# Patient Record
Sex: Male | Born: 1970 | Hispanic: Yes | Marital: Single | Smoking: Never smoker
Health system: Southern US, Community
[De-identification: ages and names within clinical notes are randomized; demographics above are authoritative.]

## PROBLEM LIST (undated history)

## (undated) ENCOUNTER — Ambulatory Visit (HOSPITAL_COMMUNITY): Payer: Self-pay | Source: Home / Self Care

## (undated) DIAGNOSIS — E785 Hyperlipidemia, unspecified: Secondary | ICD-10-CM

## (undated) DIAGNOSIS — K219 Gastro-esophageal reflux disease without esophagitis: Secondary | ICD-10-CM

## (undated) HISTORY — DX: Gastro-esophageal reflux disease without esophagitis: K21.9

## (undated) HISTORY — PX: PTERYGIUM EXCISION: SHX2273

## (undated) HISTORY — PX: OTHER SURGICAL HISTORY: SHX169

---
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
## 2009-05-03 HISTORY — PX: CARPAL TUNNEL RELEASE: SHX101

## 2015-04-22 DIAGNOSIS — S6710XA Crushing injury of unspecified finger(s), initial encounter: Secondary | ICD-10-CM

## 2015-04-22 HISTORY — DX: Crushing injury of unspecified finger(s), initial encounter: S67.10XA

## 2015-08-27 DIAGNOSIS — Z01818 Encounter for other preprocedural examination: Secondary | ICD-10-CM | POA: Insufficient documentation

## 2015-09-02 DIAGNOSIS — L608 Other nail disorders: Secondary | ICD-10-CM | POA: Insufficient documentation

## 2017-05-16 DIAGNOSIS — E782 Mixed hyperlipidemia: Secondary | ICD-10-CM | POA: Diagnosis not present

## 2017-05-16 DIAGNOSIS — K219 Gastro-esophageal reflux disease without esophagitis: Secondary | ICD-10-CM | POA: Diagnosis not present

## 2017-09-19 DIAGNOSIS — K219 Gastro-esophageal reflux disease without esophagitis: Secondary | ICD-10-CM | POA: Diagnosis not present

## 2017-09-19 DIAGNOSIS — E782 Mixed hyperlipidemia: Secondary | ICD-10-CM | POA: Diagnosis not present

## 2017-09-19 DIAGNOSIS — J060 Acute laryngopharyngitis: Secondary | ICD-10-CM | POA: Diagnosis not present

## 2018-01-26 DIAGNOSIS — N5311 Retarded ejaculation: Secondary | ICD-10-CM | POA: Diagnosis not present

## 2018-01-26 DIAGNOSIS — L2089 Other atopic dermatitis: Secondary | ICD-10-CM | POA: Diagnosis not present

## 2018-03-02 DIAGNOSIS — K219 Gastro-esophageal reflux disease without esophagitis: Secondary | ICD-10-CM | POA: Diagnosis not present

## 2018-03-02 DIAGNOSIS — R072 Precordial pain: Secondary | ICD-10-CM | POA: Diagnosis not present

## 2018-03-02 DIAGNOSIS — E782 Mixed hyperlipidemia: Secondary | ICD-10-CM | POA: Diagnosis not present

## 2018-03-14 DIAGNOSIS — N50819 Testicular pain, unspecified: Secondary | ICD-10-CM | POA: Diagnosis not present

## 2018-03-14 DIAGNOSIS — R351 Nocturia: Secondary | ICD-10-CM | POA: Diagnosis not present

## 2018-04-24 DIAGNOSIS — L988 Other specified disorders of the skin and subcutaneous tissue: Secondary | ICD-10-CM | POA: Diagnosis not present

## 2019-08-29 ENCOUNTER — Ambulatory Visit: Payer: BLUE CROSS/BLUE SHIELD | Admitting: Physician Assistant

## 2019-08-31 ENCOUNTER — Ambulatory Visit: Payer: Self-pay | Admitting: Physician Assistant

## 2019-09-14 ENCOUNTER — Ambulatory Visit: Payer: Self-pay | Admitting: Physician Assistant

## 2019-09-20 ENCOUNTER — Ambulatory Visit: Payer: Self-pay | Admitting: Physician Assistant

## 2019-09-21 ENCOUNTER — Ambulatory Visit (INDEPENDENT_AMBULATORY_CARE_PROVIDER_SITE_OTHER): Payer: Self-pay | Admitting: Physician Assistant

## 2019-09-21 ENCOUNTER — Encounter: Payer: Self-pay | Admitting: Physician Assistant

## 2019-09-21 ENCOUNTER — Other Ambulatory Visit: Payer: Self-pay

## 2019-09-21 VITALS — BP 108/62 | HR 86 | Temp 97.9°F | Ht 64.0 in | Wt 165.0 lb

## 2019-09-21 DIAGNOSIS — E782 Mixed hyperlipidemia: Secondary | ICD-10-CM

## 2019-09-21 DIAGNOSIS — N489 Disorder of penis, unspecified: Secondary | ICD-10-CM

## 2019-09-21 NOTE — Assessment & Plan Note (Signed)
Will do HSV testing Recommend to use the lotrisone cream he has bid on area until results back
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
## 2019-09-21 NOTE — Assessment & Plan Note (Signed)
Well controlled.  ?No changes to medicines.  ?Continue to work on eating a healthy diet and exercise.  ?Labs drawn today.  ?

## 2019-09-22 LAB — CBC WITH DIFFERENTIAL/PLATELET
Basophils Absolute: 0.1 10*3/uL (ref 0.0–0.2)
Basos: 1 %
EOS (ABSOLUTE): 0.2 10*3/uL (ref 0.0–0.4)
Eos: 3 %
Hematocrit: 46.8 % (ref 37.5–51.0)
Hemoglobin: 16 g/dL (ref 13.0–17.7)
Immature Grans (Abs): 0 10*3/uL (ref 0.0–0.1)
Immature Granulocytes: 0 %
Lymphocytes Absolute: 2.4 10*3/uL (ref 0.7–3.1)
Lymphs: 35 %
MCH: 30.4 pg (ref 26.6–33.0)
MCHC: 34.2 g/dL (ref 31.5–35.7)
MCV: 89 fL (ref 79–97)
Monocytes Absolute: 0.5 10*3/uL (ref 0.1–0.9)
Monocytes: 8 %
Neutrophils Absolute: 3.6 10*3/uL (ref 1.4–7.0)
Neutrophils: 53 %
Platelets: 307 10*3/uL (ref 150–450)
RBC: 5.26 x10E6/uL (ref 4.14–5.80)
RDW: 13.2 % (ref 11.6–15.4)
WBC: 6.8 10*3/uL (ref 3.4–10.8)

## 2019-09-22 LAB — COMPREHENSIVE METABOLIC PANEL
ALT: 37 IU/L (ref 0–44)
AST: 28 IU/L (ref 0–40)
Albumin/Globulin Ratio: 1.4 (ref 1.2–2.2)
Albumin: 4.7 g/dL (ref 4.0–5.0)
Alkaline Phosphatase: 81 IU/L (ref 48–121)
BUN/Creatinine Ratio: 15 (ref 9–20)
BUN: 15 mg/dL (ref 6–24)
Bilirubin Total: 0.4 mg/dL (ref 0.0–1.2)
CO2: 23 mmol/L (ref 20–29)
Calcium: 9.9 mg/dL (ref 8.7–10.2)
Chloride: 102 mmol/L (ref 96–106)
Creatinine, Ser: 1.03 mg/dL (ref 0.76–1.27)
GFR calc Af Amer: 97 mL/min/{1.73_m2} (ref >59)
GFR calc non Af Amer: 84 mL/min/{1.73_m2} (ref >59)
Globulin, Total: 3.4 g/dL (ref 1.5–4.5)
Glucose: 110 mg/dL — ABNORMAL HIGH (ref 65–99)
Potassium: 4.6 mmol/L (ref 3.5–5.2)
Sodium: 137 mmol/L (ref 134–144)
Total Protein: 8.1 g/dL (ref 6.0–8.5)

## 2019-09-22 LAB — HSV(HERPES SIMPLEX VRS) I + II AB-IGG
HSV 1 Glycoprotein G Ab, IgG: 42.9 index — ABNORMAL HIGH (ref 0.00–0.90)
HSV 2 IgG, Type Spec: 5.07 index — ABNORMAL HIGH (ref 0.00–0.90)

## 2019-09-24 ENCOUNTER — Other Ambulatory Visit: Payer: Self-pay | Admitting: Physician Assistant

## 2019-09-24 DIAGNOSIS — E782 Mixed hyperlipidemia: Secondary | ICD-10-CM

## 2019-09-24 MED ORDER — ROSUVASTATIN CALCIUM 20 MG PO TABS: 20.0000 mg | ORAL_TABLET | Freq: Every day | ORAL | 5 refills | Status: DC

## 2019-09-24 MED ORDER — VALACYCLOVIR HCL 1 G PO TABS
500.0000 mg | ORAL_TABLET | Freq: Two times a day (BID) | ORAL | 3 refills | Status: DC
Start: 2019-09-24 — End: 2020-07-22

## 2019-09-25 LAB — LIPID PANEL W/O CHOL/HDL RATIO
Cholesterol, Total: 259 mg/dL — ABNORMAL HIGH (ref 100–199)
HDL: 35 mg/dL — ABNORMAL LOW (ref >39)
LDL Chol Calc (NIH): 112 mg/dL — ABNORMAL HIGH (ref 0–99)
Triglycerides: 634 mg/dL (ref 0–149)
VLDL Cholesterol Cal: 112 mg/dL — ABNORMAL HIGH (ref 5–40)

## 2019-09-25 LAB — CARDIOVASCULAR RISK ASSESSMENT

## 2019-09-25 LAB — SPECIMEN STATUS REPORT

## 2019-09-26 ENCOUNTER — Other Ambulatory Visit: Payer: Self-pay | Admitting: Physician Assistant

## 2019-09-26 MED ORDER — ROSUVASTATIN CALCIUM 40 MG PO TABS: 40.0000 mg | ORAL_TABLET | Freq: Every day | ORAL | 1 refills | Status: DC

## 2020-03-21 ENCOUNTER — Ambulatory Visit (INDEPENDENT_AMBULATORY_CARE_PROVIDER_SITE_OTHER): Payer: Self-pay | Admitting: Legal Medicine

## 2020-03-21 ENCOUNTER — Ambulatory Visit: Payer: Self-pay | Admitting: Physician Assistant

## 2020-03-21 ENCOUNTER — Other Ambulatory Visit: Payer: Self-pay

## 2020-03-21 ENCOUNTER — Encounter: Payer: Self-pay | Admitting: Legal Medicine

## 2020-03-21 VITALS — BP 110/60 | HR 78 | Temp 97.9°F | Resp 16 | Ht 64.0 in | Wt 155.0 lb

## 2020-03-21 DIAGNOSIS — K2100 Gastro-esophageal reflux disease with esophagitis, without bleeding: Secondary | ICD-10-CM

## 2020-03-21 DIAGNOSIS — K219 Gastro-esophageal reflux disease without esophagitis: Secondary | ICD-10-CM | POA: Insufficient documentation

## 2020-03-21 DIAGNOSIS — E782 Mixed hyperlipidemia: Secondary | ICD-10-CM

## 2020-03-21 MED ORDER — OMEPRAZOLE 40 MG PO CPDR: 40.0000 mg | DELAYED_RELEASE_CAPSULE | Freq: Every day | ORAL | 2 refills | Status: DC

## 2020-03-21 MED ORDER — ROSUVASTATIN CALCIUM 40 MG PO TABS: 40.0000 mg | ORAL_TABLET | Freq: Every day | ORAL | 2 refills | Status: DC

## 2020-03-21 NOTE — Patient Instructions (Signed)
Cuidados preventivos en los hombres de 40 a 64 aos de edad Preventive Care 40-50 Years Old, Male Los cuidados preventivos hacen referencia a las opciones en cuanto al estilo de vida y a las visitas al mdico, las cuales pueden promover la salud y el bienestar. Esto puede comprender lo siguiente:  Un examen fsico anual. Esto tambin se conoce como control de bienestar anual.  Exmenes dentales y oculares de manera regular.  Vacunas.  Estudios para detectar ciertas enfermedades.  Opciones saludables de estilo de vida, como seguir una dieta saludable, hacer ejercicio regularmente, no usar drogas ni productos que contengan nicotina y tabaco, y limitar el consumo de alcohol. Qu puedo esperar para mi visita de cuidado preventivo? Examen fsico El mdico controlar lo siguiente:  Estatura y peso. Estos datos se pueden usar para calcular el ndice de masa corporal (IMC), una medicin que indica si usted tiene un peso saludable.  Frecuencia cardaca y presin arterial.  Piel para detectar manchas anormales. Asesoramiento El mdico puede hacerle preguntas sobre lo siguiente:  Consumo de tabaco, alcohol y drogas.  Bienestar emocional.  Bienestar en el hogar y sus relaciones personales.  Actividad sexual.  Hbitos de alimentacin.  Trabajo y ambiente laboral. Qu vacunas necesito?  Vacuna antigripal  Se recomienda aplicarse esta vacuna todos los aos. Vacuna contra el ttanos, la difteria y la tos ferina (Tdap)  Es posible que tenga que aplicarse un refuerzo contra el ttanos y la difteria (DT) cada 10aos. Vacuna contra la varicela  Es posible que tenga que aplicrsela si an no la recibi. Vacuna contra el herpes zster (culebrilla)  Es posible que la necesite despus de los 60 aos de edad. Vacuna contra el sarampin, la rubola y las paperas (SRP)  Es posible que necesite aplicarse al menos una dosis de la vacuna SRP si naci despus de 1957. Tambin es posible que  necesite una segunda dosis. Vacuna antineumoccica conjugada (PCV13)  Puede necesitar esta vacuna si tiene determinadas enfermedades y no se vacun anteriormente. Vacuna antineumoccica de polisacridos (PPSV23)  Quizs tenga que aplicarse una o dos dosis si fuma o si tiene determinadas afecciones. Vacuna antimeningoccica conjugada (MenACWY)  Puede necesitar esta vacuna si tiene determinadas afecciones. Vacuna contra la hepatitis A  Es posible que necesite esta vacuna si tiene ciertas afecciones o si viaja o trabaja en lugares en los que podra estar expuesto a la hepatitis A. Vacuna contra la hepatitis B  Es posible que necesite esta vacuna si tiene ciertas afecciones o si viaja o trabaja en lugares en los que podra estar expuesto a la hepatitis B. Vacuna antihaemophilus influenzae tipo B (Hib)  Es posible que necesite esta vacuna si tiene algunos factores de riesgo. Vacuna contra el virus del papiloma humano (VPH)  Si el mdico se lo recomienda, puede necesitar tres dosis a lo largo de 6 meses. Puede recibir las vacunas en forma de dosis individuales o en forma de dos o ms vacunas juntas en la misma inyeccin (vacunas combinadas). Hable con su mdico sobre los riesgos y beneficios de las vacunas combinadas. Qu pruebas necesito? Anlisis de sangre  Niveles de lpidos y colesterol. Estos se pueden verificar cada 5 aos o, con ms frecuencia, si usted tiene ms de 50 aos de edad.  Anlisis de hepatitisC.  Anlisis de hepatitisB. Pruebas de deteccin  Pruebas de deteccin de cncer de pulmn. Es posible que se le realice esta prueba de deteccin a partir de los 55 aos de edad, si ha fumado durante 30 aos un   paquete diario y sigue fumando o dej el hbito en algn momento en los ltimos 15 aos.  Examen de deteccin del cncer de prstata. Las recomendaciones variarn segn sus antecedentes familiares y otros riesgos.  Pruebas de deteccin de cncer colorrectal. Todos los  adultos a partir de los 50 aos de edad y hasta los 75 aos de edad deben hacerse esta prueba de deteccin. El mdico puede recomendarle las pruebas de deteccin a partir de los 45 aos de edad si corre un mayor riesgo. Le realizarn pruebas cada 1 a 10 aos, segn los resultados y el tipo de prueba de deteccin.  Pruebas de deteccin de la diabetes. Esto se realiza mediante un control del azcar en la sangre (glucosa) despus de no haber comido durante un periodo de tiempo (ayuno). Es posible que se le realice esta prueba cada 1 a 3 aos.  Anlisis de enfermedades de transmisin sexual (ETS). Siga estas instrucciones en su casa: Comida y bebida  Siga una dieta que incluya frutas y verduras frescas, cereales integrales, protenas magras y productos lcteos descremados.  Tome los suplementos vitamnicos y minerales como se lo haya indicado el mdico.  No beba alcohol si el mdico se lo prohbe.  Si bebe alcohol: ? Limite la cantidad que consume de 0 a 2 medidas por da. ? Est atento a la cantidad de alcohol que hay en las bebidas que toma. En [place], una medida equivale a una botella de cerveza de 12oz (355ml), un vaso de vino de 5oz (148ml) o un vaso de una bebida alcohlica de alta graduacin de 1oz (44ml). Estilo de vida  Cudese los dientes y las encas a diario.  Mantngase activo. Haga al menos 30minutos de ejercicio 5o ms das de la semana.  No consuma ningn producto que contenga nicotina o tabaco, como cigarrillos, cigarrillos electrnicos y tabaco de mascar. Si necesita ayuda para dejar de fumar, consulte al mdico.  Si es sexualmente activo, practique sexo seguro. Use un condn u otra forma de proteccin para prevenir las ITS (infecciones de transmisin sexual).  Hable con el mdico acerca de tomar una dosis baja de aspirina o estatina todos los das a partir de los 50 aos. Cundo volver?  Acuda al mdico una vez al ao para una visita de  control.  Pregntele al mdico con qu frecuencia debe realizarse un control de la vista y los dientes.  Mantenga su esquema de vacunacin al da. Esta informacin no tiene como fin reemplazar el consejo del mdico. Asegrese de hacerle al mdico cualquier pregunta que tenga. Document Revised: 05/12/2018 Document Reviewed: 05/12/2018 Elsevier Patient Education  2020 Elsevier Inc.  
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
## 2020-03-22 LAB — COMPREHENSIVE METABOLIC PANEL
ALT: 28 IU/L (ref 0–44)
AST: 22 IU/L (ref 0–40)
Albumin/Globulin Ratio: 1.7 (ref 1.2–2.2)
Albumin: 5.1 g/dL — ABNORMAL HIGH (ref 4.0–5.0)
Alkaline Phosphatase: 81 IU/L (ref 44–121)
BUN/Creatinine Ratio: 15 (ref 9–20)
BUN: 16 mg/dL (ref 6–24)
Bilirubin Total: 0.5 mg/dL (ref 0.0–1.2)
CO2: 21 mmol/L (ref 20–29)
Calcium: 10 mg/dL (ref 8.7–10.2)
Chloride: 102 mmol/L (ref 96–106)
Creatinine, Ser: 1.04 mg/dL (ref 0.76–1.27)
GFR calc Af Amer: 96 mL/min/{1.73_m2} (ref >59)
GFR calc non Af Amer: 83 mL/min/{1.73_m2} (ref >59)
Globulin, Total: 3 g/dL (ref 1.5–4.5)
Glucose: 109 mg/dL — ABNORMAL HIGH (ref 65–99)
Potassium: 4.8 mmol/L (ref 3.5–5.2)
Sodium: 140 mmol/L (ref 134–144)
Total Protein: 8.1 g/dL (ref 6.0–8.5)

## 2020-03-22 LAB — CBC WITH DIFFERENTIAL/PLATELET
Basophils Absolute: 0.1 10*3/uL (ref 0.0–0.2)
Basos: 1 %
EOS (ABSOLUTE): 0.2 10*3/uL (ref 0.0–0.4)
Eos: 4 %
Hematocrit: 46.3 % (ref 37.5–51.0)
Hemoglobin: 15.8 g/dL (ref 13.0–17.7)
Immature Grans (Abs): 0 10*3/uL (ref 0.0–0.1)
Immature Granulocytes: 0 %
Lymphocytes Absolute: 2.6 10*3/uL (ref 0.7–3.1)
Lymphs: 43 %
MCH: 29.5 pg (ref 26.6–33.0)
MCHC: 34.1 g/dL (ref 31.5–35.7)
MCV: 87 fL (ref 79–97)
Monocytes Absolute: 0.5 10*3/uL (ref 0.1–0.9)
Monocytes: 9 %
Neutrophils Absolute: 2.6 10*3/uL (ref 1.4–7.0)
Neutrophils: 43 %
Platelets: 332 10*3/uL (ref 150–450)
RBC: 5.35 x10E6/uL (ref 4.14–5.80)
RDW: 12.8 % (ref 11.6–15.4)
WBC: 6 10*3/uL (ref 3.4–10.8)

## 2020-03-22 LAB — LIPID PANEL
Chol/HDL Ratio: 7 ratio — ABNORMAL HIGH (ref 0.0–5.0)
Cholesterol, Total: 281 mg/dL — ABNORMAL HIGH (ref 100–199)
HDL: 40 mg/dL (ref >39)
LDL Chol Calc (NIH): 166 mg/dL — ABNORMAL HIGH (ref 0–99)
Triglycerides: 386 mg/dL — ABNORMAL HIGH (ref 0–149)
VLDL Cholesterol Cal: 75 mg/dL — ABNORMAL HIGH (ref 5–40)

## 2020-03-22 LAB — CARDIOVASCULAR RISK ASSESSMENT

## 2020-03-23 NOTE — Progress Notes (Signed)
CBC negative, glucose 109, kidney and liver tests normal, triglycerides very high and LDL cholesterol very high.  If her taking rosuvastatin regularly? May need lipid clinic, he is at high risk to cv disease and stroke early. lp

## 2020-07-21 ENCOUNTER — Ambulatory Visit: Payer: Self-pay | Admitting: Legal Medicine

## 2020-07-22 ENCOUNTER — Ambulatory Visit (INDEPENDENT_AMBULATORY_CARE_PROVIDER_SITE_OTHER): Payer: 59 | Admitting: Legal Medicine

## 2020-07-22 ENCOUNTER — Encounter: Payer: Self-pay | Admitting: Legal Medicine

## 2020-07-22 ENCOUNTER — Other Ambulatory Visit: Payer: Self-pay

## 2020-07-22 DIAGNOSIS — R1084 Generalized abdominal pain: Secondary | ICD-10-CM

## 2020-07-22 DIAGNOSIS — L720 Epidermal cyst: Secondary | ICD-10-CM

## 2020-07-22 DIAGNOSIS — R109 Unspecified abdominal pain: Secondary | ICD-10-CM | POA: Insufficient documentation
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
## 2020-07-22 NOTE — Patient Instructions (Signed)
Cuidado de las heridas, en adultos Wound Care, Adult El cuidado correcto de las heridas puede ayudar a Psychologist, sport and exercise y a Radio producer las infecciones y formacin de cicatrices. Adems, puede ayudar a que la cicatrizacin sea ms rpida. Siga las instrucciones del mdico acerca del cuidado de la herida. Materiales necesarios:  [place] y [place].  Limpiador de heridas.  Gasa.  Si es necesario, una venda limpia (vendaje) u otro tipo de material de vendaje para cubrir o Pharmacist, hospital herida. Siga las instrucciones del mdico acerca de qu materiales de vendaje usar.  Crema o pomada para aplicar en la herida, si se lo indic el mdico. Cmo cuidar la herida Limpieza de la herida Pregntele al mdico cmo limpiar la herida. Esto puede incluir:  Usar agua y [place] o un limpiador de heridas.  Usar una gasa limpia para secar la herida dando palmaditas despus de limpiarla. No se frote ni restriegue la herida. Cuidado del vendaje  Lvese las manos con agua y [place] durante al menos 20segundos antes y despus de cambiar el vendaje. Use desinfectante para manos si no dispone de [place] y [place].  Cambie el vendaje como se lo haya indicado el mdico. Esto puede incluir: ? Limpieza o enjuague (irrigacin) de la herida. ? Colocar un vendaje sobre la herida o dentro de la herida (taponamiento). ? Cubrir la herida con un vendaje externo.  No retire los puntos (suturas), la goma para cerrar la piel o las tiras [place]. Es posible que estos cierres cutneos deban quedar puestos en la piel durante 2semanas o ms tiempo. Si los bordes de las tiras [address] empiezan a despegarse y Scientific laboratory technician, puede recortar los que estn sueltos. No retire las tiras [employer] por completo a menos que el mdico se lo indique.  Pregntele al mdico cundo puede dejar la herida al descubierto. Deteccin de Building control surveyor la zona de la herida [employer] para detectar signos de infeccin. Est atento a los siguientes  signos:  Aumento del enrojecimiento, la hinchazn o Chief Technology Officer.  Lquido o sangre.  Calor.  Pus o mal olor.   Sigue estas instrucciones en tu casa Medicamentos  Si le recetaron un medicamento, una crema o un ungento con antibitico, tmelo o aplqueselo como se lo haya indicado el mdico. No deje de usar el antibitico aunque la afeccin mejore.  Si le recetaron Research scientist (physical sciences), tmelo antes de Sales executive cuidado de la herida, [place] se lo haya indicado el mdico.  [employer] medicamentos de venta libre y los recetados solamente como se lo haya indicado el mdico. Comida y bebida  Siga una dieta que incluya protenas, vitaminas A y C y otros alimentos ricos en nutrientes que ayudarn a que la herida cicatrice. ? Los alimentos ricos en protena incluyen carne, pescado, huevos, productos lcteos, frijoles y frutos secos. ? Los alimentos ricos en vitaminaA incluyen zanahorias y verduras de hoja verde oscuro. ? Los alimentos ricos en vitaminaC incluyen ctricos, tomates, brcoli y pimientos.  Beba suficiente lquido como para Pharmacologist la orina de color amarillo plido. Instrucciones generales  No tome baos de inmersin, nade, use el jacuzzi ni haga ninguna actividad en la que sumerja la herida en agua hasta que el mdico lo autorice. Pregntele al mdico si puede ducharse. Delle Reining solo le permitan darse baos de [place].  No se rasque ni se toque la herida. Mantngala cubierta tal como se lo haya indicado el mdico.  Retome sus actividades normales segn lo indicado por el mdico. Pregntele  al mdico qu actividades son seguras para usted.  Proteja la herida del sol durante los primeros o durante el tiempo que le haya indicado el mdico. Cubra la zona de la cicatriz o aplquele pantalla solar con un factor de proteccin solar (FPS) de al menos 30.  No consuma ningn producto que contenga nicotina o tabaco, como cigarrillos, cigarrillos electrnicos y tabaco de Theatre manager.  El consumo de esos productos puede demorar la cicatrizacin de la herida. Si necesita ayuda para dejar de fumar, consulte al mdico.  Concurra a todas las visitas de seguimiento como se lo haya indicado el mdico. Esto es importante. Comunquese con un mdico si:  Le aplicaron la antitetnica y tiene hinchazn, dolor intenso, enrojecimiento o hemorragia en el sitio de la inyeccin.  El dolor no se alivia con los [employer].  Tiene cualquiera de estos signos de infeccin: ? Ms enrojecimiento, hinchazn o dolor alrededor de la herida. ? Lquido o [employer] de la herida. ? Calor que proviene de la herida. ? Advierte pus o mal olor que provienen de la herida. ? Grant Ruts o escalofros.  Siente nuseas o vomita.  Tiene mareos. Solicite ayuda de inmediato si:  Tiene una lnea roja en la piel cerca de la zona que rodea la herida.  La herida fue cerrada con grapas, suturas, goma para cerrar la piel o tiras [place] y estas comienzan a abrirse y a separarse.  La herida sangra y el sangrado no se detiene con una presin [place].  Tiene una erupcin cutnea.  Se desmaya.  Tiene dificultad para respirar. Estos sntomas pueden representar un problema grave que constituye Radio broadcast assistant. No espere a ver si los sntomas desaparecen. Solicite atencin mdica de inmediato. Comunquese con el servicio de emergencias de su localidad (911 en los [place]). No conduzca por sus propios medios [employer]. Resumen  Siempre lvese las manos con agua y jabn durante al menos 20segundos antes y despus de cambiar el vendaje.  Cambie el vendaje como se lo haya indicado el mdico.  Para ayudar con la cicatrizacin, coma alimentos ricos en protenas, vitaminas A y C y dems nutrientes.  Controle la herida [employer] para detectar signos de infeccin. Comunquese con el mdico si sospecha que la herida est infectada. Esta informacin no tiene Theme park manager el consejo del  mdico. Asegrese de hacerle al mdico cualquier pregunta que tenga. Document Revised: 05/01/2019 Document Reviewed: 05/01/2019 Elsevier Patient Education  2021 [employer].

## 2020-07-27 LAB — CDIFF NAA+O+P+STOOL CULTURE
E coli, Shiga toxin Assay: NEGATIVE
Toxigenic C. Difficile by PCR: NEGATIVE

## 2020-07-28 ENCOUNTER — Other Ambulatory Visit: Payer: Self-pay

## 2020-07-28 ENCOUNTER — Ambulatory Visit (INDEPENDENT_AMBULATORY_CARE_PROVIDER_SITE_OTHER): Payer: 59 | Admitting: Legal Medicine

## 2020-07-28 ENCOUNTER — Encounter: Payer: Self-pay | Admitting: Legal Medicine

## 2020-07-28 VITALS — BP 124/80 | HR 102 | Temp 98.1°F | Resp 16 | Ht 64.0 in | Wt 160.0 lb

## 2020-07-28 DIAGNOSIS — L720 Epidermal cyst: Secondary | ICD-10-CM | POA: Diagnosis not present

## 2020-07-28 DIAGNOSIS — R1084 Generalized abdominal pain: Secondary | ICD-10-CM

## 2020-07-28 DIAGNOSIS — R1011 Right upper quadrant pain: Secondary | ICD-10-CM | POA: Diagnosis not present
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
## 2020-07-28 NOTE — Progress Notes (Signed)
Stool cultures normal lp

## 2020-07-29 ENCOUNTER — Other Ambulatory Visit: Payer: Self-pay | Admitting: Legal Medicine

## 2020-07-29 ENCOUNTER — Other Ambulatory Visit: Payer: Self-pay

## 2020-07-29 ENCOUNTER — Telehealth: Payer: Self-pay

## 2020-07-29 LAB — ANAEROBIC AND AEROBIC CULTURE

## 2020-07-29 LAB — HEPATIC FUNCTION PANEL
ALT: 36 IU/L (ref 0–44)
AST: 28 IU/L (ref 0–40)
Albumin: 4.8 g/dL (ref 4.0–5.0)
Alkaline Phosphatase: 76 IU/L (ref 44–121)
Bilirubin Total: 0.3 mg/dL (ref 0.0–1.2)
Bilirubin, Direct: <0.1 mg/dL (ref 0.00–0.40)
Total Protein: 7.7 g/dL (ref 6.0–8.5)

## 2020-07-29 MED ORDER — CIPROFLOXACIN HCL 500 MG PO TABS: 500.0000 mg | ORAL_TABLET | Freq: Two times a day (BID) | ORAL | 0 refills | Status: DC

## 2020-07-29 NOTE — Telephone Encounter (Signed)
Patient is allergic to sulfas. Do you want me to send another antibiotic?

## 2020-07-29 NOTE — Telephone Encounter (Signed)
Patient was informed.

## 2020-07-29 NOTE — Telephone Encounter (Signed)
I called in cipro lp

## 2020-07-29 NOTE — Progress Notes (Signed)
Liver tests all normal,  lp

## 2020-07-29 NOTE — Progress Notes (Signed)
Culture grew klebsiella sensitive to bactrim, finish medicine lp

## 2020-08-05 ENCOUNTER — Ambulatory Visit: Payer: 59 | Admitting: Legal Medicine

## 2020-08-13 ENCOUNTER — Ambulatory Visit: Payer: 59 | Admitting: Legal Medicine

## 2020-08-19 ENCOUNTER — Encounter: Payer: Self-pay | Admitting: Legal Medicine

## 2020-08-19 ENCOUNTER — Other Ambulatory Visit: Payer: Self-pay

## 2020-08-19 ENCOUNTER — Ambulatory Visit (INDEPENDENT_AMBULATORY_CARE_PROVIDER_SITE_OTHER): Payer: 59 | Admitting: Legal Medicine

## 2020-08-19 VITALS — BP 118/60 | HR 96 | Temp 98.3°F | Resp 16 | Ht 64.0 in | Wt 156.0 lb

## 2020-08-19 DIAGNOSIS — F5102 Adjustment insomnia: Secondary | ICD-10-CM

## 2020-08-19 DIAGNOSIS — G4700 Insomnia, unspecified: Secondary | ICD-10-CM | POA: Insufficient documentation

## 2020-08-19 DIAGNOSIS — R1084 Generalized abdominal pain: Secondary | ICD-10-CM

## 2020-08-19 MED ORDER — TRAZODONE HCL 50 MG PO TABS
50.0000 mg | ORAL_TABLET | Freq: Every day | ORAL | 3 refills | Status: DC
Start: 2020-08-19 — End: 2021-06-11
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
## 2020-09-19 ENCOUNTER — Ambulatory Visit: Payer: Self-pay | Admitting: Legal Medicine

## 2020-09-19 ENCOUNTER — Ambulatory Visit: Payer: Self-pay | Admitting: Physician Assistant

## 2020-09-26 ENCOUNTER — Encounter: Payer: Self-pay | Admitting: Physician Assistant

## 2020-09-26 ENCOUNTER — Ambulatory Visit (INDEPENDENT_AMBULATORY_CARE_PROVIDER_SITE_OTHER): Payer: 59 | Admitting: Physician Assistant

## 2020-09-26 ENCOUNTER — Other Ambulatory Visit: Payer: Self-pay

## 2020-09-26 VITALS — BP 108/68 | HR 74 | Temp 97.5°F | Ht 64.0 in | Wt 152.8 lb

## 2020-09-26 DIAGNOSIS — Z1159 Encounter for screening for other viral diseases: Secondary | ICD-10-CM | POA: Diagnosis not present

## 2020-09-26 DIAGNOSIS — Z114 Encounter for screening for human immunodeficiency virus [HIV]: Secondary | ICD-10-CM | POA: Insufficient documentation

## 2020-09-26 DIAGNOSIS — E782 Mixed hyperlipidemia: Secondary | ICD-10-CM

## 2020-09-26 MED ORDER — ROSUVASTATIN CALCIUM 40 MG PO TABS: 40.0000 mg | ORAL_TABLET | Freq: Every day | ORAL | 5 refills | Status: DC
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
## 2020-09-27 LAB — CBC WITH DIFFERENTIAL/PLATELET
Basophils Absolute: 0.1 10*3/uL (ref 0.0–0.2)
Basos: 1 %
EOS (ABSOLUTE): 0.1 10*3/uL (ref 0.0–0.4)
Eos: 1 %
Hematocrit: 42.4 % (ref 37.5–51.0)
Hemoglobin: 14.3 g/dL (ref 13.0–17.7)
Immature Grans (Abs): 0 10*3/uL (ref 0.0–0.1)
Immature Granulocytes: 0 %
Lymphocytes Absolute: 1.9 10*3/uL (ref 0.7–3.1)
Lymphs: 31 %
MCH: 29.5 pg (ref 26.6–33.0)
MCHC: 33.7 g/dL (ref 31.5–35.7)
MCV: 88 fL (ref 79–97)
Monocytes Absolute: 0.4 10*3/uL (ref 0.1–0.9)
Monocytes: 7 %
Neutrophils Absolute: 3.7 10*3/uL (ref 1.4–7.0)
Neutrophils: 60 %
Platelets: 322 10*3/uL (ref 150–450)
RBC: 4.84 x10E6/uL (ref 4.14–5.80)
RDW: 13.1 % (ref 11.6–15.4)
WBC: 6.2 10*3/uL (ref 3.4–10.8)

## 2020-09-27 LAB — COMPREHENSIVE METABOLIC PANEL
ALT: 21 IU/L (ref 0–44)
AST: 23 IU/L (ref 0–40)
Albumin/Globulin Ratio: 1.8 (ref 1.2–2.2)
Albumin: 4.7 g/dL (ref 3.8–4.9)
Alkaline Phosphatase: 77 IU/L (ref 44–121)
BUN/Creatinine Ratio: 16 (ref 9–20)
BUN: 14 mg/dL (ref 6–24)
Bilirubin Total: 0.4 mg/dL (ref 0.0–1.2)
CO2: 23 mmol/L (ref 20–29)
Calcium: 9.4 mg/dL (ref 8.7–10.2)
Chloride: 104 mmol/L (ref 96–106)
Creatinine, Ser: 0.9 mg/dL (ref 0.76–1.27)
Globulin, Total: 2.6 g/dL (ref 1.5–4.5)
Glucose: 109 mg/dL — ABNORMAL HIGH (ref 65–99)
Potassium: 4.3 mmol/L (ref 3.5–5.2)
Sodium: 140 mmol/L (ref 134–144)
Total Protein: 7.3 g/dL (ref 6.0–8.5)
eGFR: 103 mL/min/{1.73_m2} (ref >59)

## 2020-09-27 LAB — LIPID PANEL
Chol/HDL Ratio: 6 ratio — ABNORMAL HIGH (ref 0.0–5.0)
Cholesterol, Total: 228 mg/dL — ABNORMAL HIGH (ref 100–199)
HDL: 38 mg/dL — ABNORMAL LOW (ref >39)
LDL Chol Calc (NIH): 147 mg/dL — ABNORMAL HIGH (ref 0–99)
Triglycerides: 233 mg/dL — ABNORMAL HIGH (ref 0–149)
VLDL Cholesterol Cal: 43 mg/dL — ABNORMAL HIGH (ref 5–40)

## 2020-09-27 LAB — HEPATITIS C ANTIBODY: Hep C Virus Ab: <0.1 s/co ratio (ref 0.0–0.9)

## 2020-09-27 LAB — CARDIOVASCULAR RISK ASSESSMENT

## 2020-09-27 LAB — HIV ANTIBODY (ROUTINE TESTING W REFLEX): HIV Screen 4th Generation wRfx: NONREACTIVE

## 2020-12-31 ENCOUNTER — Ambulatory Visit (INDEPENDENT_AMBULATORY_CARE_PROVIDER_SITE_OTHER): Payer: 59 | Admitting: Physician Assistant

## 2020-12-31 ENCOUNTER — Encounter: Payer: Self-pay | Admitting: Physician Assistant

## 2020-12-31 VITALS — BP 122/70 | HR 81 | Temp 97.4°F | Ht 64.0 in | Wt 153.6 lb

## 2020-12-31 DIAGNOSIS — J020 Streptococcal pharyngitis: Secondary | ICD-10-CM | POA: Diagnosis not present

## 2020-12-31 LAB — POCT RAPID STREP A (OFFICE): Rapid Strep A Screen: POSITIVE — AB

## 2020-12-31 MED ORDER — AZITHROMYCIN 250 MG PO TABS: ORAL_TABLET | ORAL | 0 refills | Status: AC
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
## 2021-01-30 ENCOUNTER — Ambulatory Visit (INDEPENDENT_AMBULATORY_CARE_PROVIDER_SITE_OTHER): Payer: Self-pay | Admitting: Physician Assistant

## 2021-01-30 ENCOUNTER — Encounter: Payer: Self-pay | Admitting: Physician Assistant

## 2021-01-30 ENCOUNTER — Other Ambulatory Visit: Payer: Self-pay

## 2021-01-30 VITALS — BP 102/64 | HR 66 | Temp 97.3°F | Resp 18 | Ht 64.0 in | Wt 148.6 lb

## 2021-01-30 DIAGNOSIS — R634 Abnormal weight loss: Secondary | ICD-10-CM

## 2021-01-30 DIAGNOSIS — R632 Polyphagia: Secondary | ICD-10-CM

## 2021-01-30 DIAGNOSIS — E782 Mixed hyperlipidemia: Secondary | ICD-10-CM

## 2021-01-30 LAB — POCT URINALYSIS DIP (CLINITEK)
Bilirubin, UA: NEGATIVE
Blood, UA: NEGATIVE
Glucose, UA: NEGATIVE mg/dL
Ketones, POC UA: NEGATIVE mg/dL
Leukocytes, UA: NEGATIVE
Nitrite, UA: NEGATIVE
Spec Grav, UA: 1.025 (ref 1.010–1.025)
Urobilinogen, UA: 0.2 E.U./dL
pH, UA: 6 (ref 5.0–8.0)
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
## 2021-01-31 LAB — CARDIOVASCULAR RISK ASSESSMENT

## 2021-01-31 LAB — COMPREHENSIVE METABOLIC PANEL
ALT: 26 IU/L (ref 0–44)
AST: 32 IU/L (ref 0–40)
Albumin/Globulin Ratio: 1.4 (ref 1.2–2.2)
Albumin: 4.6 g/dL (ref 3.8–4.9)
Alkaline Phosphatase: 94 IU/L (ref 44–121)
BUN/Creatinine Ratio: 19 (ref 9–20)
BUN: 20 mg/dL (ref 6–24)
Bilirubin Total: 0.3 mg/dL (ref 0.0–1.2)
CO2: 20 mmol/L (ref 20–29)
Calcium: 9.5 mg/dL (ref 8.7–10.2)
Chloride: 100 mmol/L (ref 96–106)
Creatinine, Ser: 1.08 mg/dL (ref 0.76–1.27)
Globulin, Total: 3.2 g/dL (ref 1.5–4.5)
Glucose: 106 mg/dL — ABNORMAL HIGH (ref 70–99)
Potassium: 4.3 mmol/L (ref 3.5–5.2)
Sodium: 135 mmol/L (ref 134–144)
Total Protein: 7.8 g/dL (ref 6.0–8.5)
eGFR: 83 mL/min/{1.73_m2} (ref >59)

## 2021-01-31 LAB — LIPID PANEL
Chol/HDL Ratio: 5.6 ratio — ABNORMAL HIGH (ref 0.0–5.0)
Cholesterol, Total: 320 mg/dL — ABNORMAL HIGH (ref 100–199)
HDL: 57 mg/dL (ref >39)
LDL Chol Calc (NIH): 248 mg/dL — ABNORMAL HIGH (ref 0–99)
Triglycerides: 92 mg/dL (ref 0–149)
VLDL Cholesterol Cal: 15 mg/dL (ref 5–40)

## 2021-01-31 LAB — CBC WITH DIFFERENTIAL/PLATELET
Basophils Absolute: 0.1 10*3/uL (ref 0.0–0.2)
Basos: 2 %
EOS (ABSOLUTE): 0.3 10*3/uL (ref 0.0–0.4)
Eos: 5 %
Hematocrit: 43.5 % (ref 37.5–51.0)
Hemoglobin: 14.3 g/dL (ref 13.0–17.7)
Immature Grans (Abs): 0 10*3/uL (ref 0.0–0.1)
Immature Granulocytes: 0 %
Lymphocytes Absolute: 2.6 10*3/uL (ref 0.7–3.1)
Lymphs: 46 %
MCH: 28 pg (ref 26.6–33.0)
MCHC: 32.9 g/dL (ref 31.5–35.7)
MCV: 85 fL (ref 79–97)
Monocytes Absolute: 0.3 10*3/uL (ref 0.1–0.9)
Monocytes: 6 %
Neutrophils Absolute: 2.2 10*3/uL (ref 1.4–7.0)
Neutrophils: 41 %
Platelets: 441 10*3/uL (ref 150–450)
RBC: 5.1 x10E6/uL (ref 4.14–5.80)
RDW: 12.1 % (ref 11.6–15.4)
WBC: 5.4 10*3/uL (ref 3.4–10.8)

## 2021-01-31 LAB — TSH: TSH: 18.9 u[IU]/mL — ABNORMAL HIGH (ref 0.450–4.500)

## 2021-02-02 ENCOUNTER — Other Ambulatory Visit: Payer: Self-pay | Admitting: Physician Assistant

## 2021-02-02 DIAGNOSIS — E782 Mixed hyperlipidemia: Secondary | ICD-10-CM

## 2021-02-02 DIAGNOSIS — E038 Other specified hypothyroidism: Secondary | ICD-10-CM

## 2021-02-02 MED ORDER — LEVOTHYROXINE SODIUM 50 MCG PO TABS: 50.0000 ug | ORAL_TABLET | Freq: Every day | ORAL | 3 refills | Status: DC

## 2021-02-02 MED ORDER — ATORVASTATIN CALCIUM 40 MG PO TABS: 40.0000 mg | ORAL_TABLET | Freq: Every day | ORAL | 3 refills | Status: DC

## 2021-02-10 ENCOUNTER — Encounter (HOSPITAL_COMMUNITY): Payer: Self-pay | Admitting: Emergency Medicine

## 2021-02-10 ENCOUNTER — Emergency Department (HOSPITAL_COMMUNITY): Payer: Worker's Compensation

## 2021-02-10 ENCOUNTER — Emergency Department (HOSPITAL_COMMUNITY)
Admission: EM | Admit: 2021-02-10 | Discharge: 2021-02-11 | Disposition: A | Discharge status: Home / Self Care | Payer: Worker's Compensation | Attending: Emergency Medicine | Admitting: Emergency Medicine

## 2021-02-10 ENCOUNTER — Other Ambulatory Visit: Payer: Self-pay

## 2021-02-10 DIAGNOSIS — W11XXXA Fall on and from ladder, initial encounter: Secondary | ICD-10-CM | POA: Diagnosis not present

## 2021-02-10 DIAGNOSIS — S299XXA Unspecified injury of thorax, initial encounter: Secondary | ICD-10-CM | POA: Diagnosis present

## 2021-02-10 DIAGNOSIS — S2232XA Fracture of one rib, left side, initial encounter for closed fracture: Secondary | ICD-10-CM | POA: Diagnosis not present

## 2021-02-10 LAB — COMPREHENSIVE METABOLIC PANEL
ALT: 30 U/L (ref 0–44)
AST: 39 U/L (ref 15–41)
Albumin: 4 g/dL (ref 3.5–5.0)
Alkaline Phosphatase: 81 U/L (ref 38–126)
Anion gap: 9 (ref 5–15)
BUN: 18 mg/dL (ref 6–20)
CO2: 23 mmol/L (ref 22–32)
Calcium: 9 mg/dL (ref 8.9–10.3)
Chloride: 104 mmol/L (ref 98–111)
Creatinine, Ser: 1.06 mg/dL (ref 0.61–1.24)
GFR, Estimated: >60 mL/min (ref >60)
Glucose, Bld: 97 mg/dL (ref 70–99)
Potassium: 3.8 mmol/L (ref 3.5–5.1)
Sodium: 136 mmol/L (ref 135–145)
Total Bilirubin: 0.6 mg/dL (ref 0.3–1.2)
Total Protein: 7.4 g/dL (ref 6.5–8.1)

## 2021-02-10 LAB — CBC WITH DIFFERENTIAL/PLATELET
Abs Immature Granulocytes: 0.01 10*3/uL (ref 0.00–0.07)
Basophils Absolute: 0.1 10*3/uL (ref 0.0–0.1)
Basophils Relative: 1 %
Eosinophils Absolute: 0.1 10*3/uL (ref 0.0–0.5)
Eosinophils Relative: 2 %
HCT: 38.9 % — ABNORMAL LOW (ref 39.0–52.0)
Hemoglobin: 12.6 g/dL — ABNORMAL LOW (ref 13.0–17.0)
Immature Granulocytes: 0 %
Lymphocytes Relative: 34 %
Lymphs Abs: 2.2 10*3/uL (ref 0.7–4.0)
MCH: 28.4 pg (ref 26.0–34.0)
MCHC: 32.4 g/dL (ref 30.0–36.0)
MCV: 87.8 fL (ref 80.0–100.0)
Monocytes Absolute: 0.4 10*3/uL (ref 0.1–1.0)
Monocytes Relative: 6 %
Neutro Abs: 3.8 10*3/uL (ref 1.7–7.7)
Neutrophils Relative %: 57 %
Platelets: 285 10*3/uL (ref 150–400)
RBC: 4.43 MIL/uL (ref 4.22–5.81)
RDW: 13.8 % (ref 11.5–15.5)
WBC: 6.7 10*3/uL (ref 4.0–10.5)
nRBC: 0 % (ref 0.0–0.2)

## 2021-02-10 LAB — LIPASE, BLOOD: Lipase: 41 U/L (ref 11–51)

## 2021-02-10 NOTE — ED Triage Notes (Signed)
Patient fell from a ladder approx.5 ft. this evening , no LOc/ambulatory , reports pain at left posterior ribcage and left shoulder pain, respirations unlabored , no head or neck injury .

## 2021-02-10 NOTE — ED Provider Notes (Signed)
Emergency Medicine Provider Triage Evaluation Note  Garrett Stone , a 50 y.o. male  was evaluated in triage.  Pt complains of fall 6 ft from a ladder pta. States he fell onto his back and landed on a gas meter. Denies head trauma or loc.  Review of Systems  Positive: Back pain Negative: Head injury, loc  Physical Exam  BP 109/75 (BP Location: Left Arm)   Pulse 68   Temp 99.1 F (37.3 C) (Oral)   Resp 18   SpO2 96%  Gen:   Awake, no distress   Resp:  Normal effort  MSK:   Moves extremities without difficulty  Other:  Ttp to the left CVA and left posterolateral rib ttp, mild luq ttp as well  Medical Decision Making  Medically screening exam initiated at 7:37 PM.  Appropriate orders placed.  Garrett Stone was informed that the remainder of the evaluation will be completed by another provider, this initial triage assessment does not replace that evaluation, and the importance of remaining in the ED until their evaluation is complete.     Garrett Stone 02/10/21 1939    Garrett Plan, MD 02/10/21 [id number]

## 2021-02-11 MED ORDER — HYDROCODONE-ACETAMINOPHEN 5-325 MG PO TABS: 1.0000 | ORAL_TABLET | ORAL | 0 refills | Status: DC | PRN
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
## 2021-03-20 ENCOUNTER — Ambulatory Visit: Payer: Self-pay | Admitting: Physician Assistant

## 2021-04-03 ENCOUNTER — Ambulatory Visit: Payer: 59 | Admitting: Physician Assistant

## 2021-05-15 ENCOUNTER — Ambulatory Visit: Payer: Self-pay | Admitting: Physician Assistant

## 2021-05-22 ENCOUNTER — Ambulatory Visit: Payer: Self-pay | Admitting: Physician Assistant

## 2021-06-05 ENCOUNTER — Ambulatory Visit: Payer: Self-pay | Admitting: Physician Assistant

## 2021-06-11 ENCOUNTER — Other Ambulatory Visit: Payer: Self-pay

## 2021-06-11 ENCOUNTER — Ambulatory Visit: Payer: Managed Care, Other (non HMO) | Admitting: Physician Assistant

## 2021-06-11 ENCOUNTER — Encounter: Payer: Self-pay | Admitting: Physician Assistant

## 2021-06-11 VITALS — BP 118/68 | HR 68 | Temp 98.3°F | Ht 64.0 in | Wt 154.0 lb

## 2021-06-11 DIAGNOSIS — M25511 Pain in right shoulder: Secondary | ICD-10-CM | POA: Diagnosis not present

## 2021-06-11 DIAGNOSIS — M25512 Pain in left shoulder: Secondary | ICD-10-CM

## 2021-06-11 DIAGNOSIS — E038 Other specified hypothyroidism: Secondary | ICD-10-CM | POA: Diagnosis not present

## 2021-06-11 DIAGNOSIS — E782 Mixed hyperlipidemia: Secondary | ICD-10-CM
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
## 2021-06-12 ENCOUNTER — Other Ambulatory Visit: Payer: Self-pay | Admitting: Physician Assistant

## 2021-06-12 DIAGNOSIS — E782 Mixed hyperlipidemia: Secondary | ICD-10-CM

## 2021-06-12 DIAGNOSIS — E038 Other specified hypothyroidism: Secondary | ICD-10-CM

## 2021-06-12 LAB — CBC WITH DIFFERENTIAL/PLATELET
Basophils Absolute: 0.1 10*3/uL (ref 0.0–0.2)
Basos: 1 %
EOS (ABSOLUTE): 0.1 10*3/uL (ref 0.0–0.4)
Eos: 1 %
Hematocrit: 43.1 % (ref 37.5–51.0)
Hemoglobin: 14.4 g/dL (ref 13.0–17.7)
Immature Grans (Abs): 0 10*3/uL (ref 0.0–0.1)
Immature Granulocytes: 0 %
Lymphocytes Absolute: 2 10*3/uL (ref 0.7–3.1)
Lymphs: 29 %
MCH: 29.4 pg (ref 26.6–33.0)
MCHC: 33.4 g/dL (ref 31.5–35.7)
MCV: 88 fL (ref 79–97)
Monocytes Absolute: 0.5 10*3/uL (ref 0.1–0.9)
Monocytes: 7 %
Neutrophils Absolute: 4.3 10*3/uL (ref 1.4–7.0)
Neutrophils: 62 %
Platelets: 350 10*3/uL (ref 150–450)
RBC: 4.9 x10E6/uL (ref 4.14–5.80)
RDW: 13 % (ref 11.6–15.4)
WBC: 6.9 10*3/uL (ref 3.4–10.8)

## 2021-06-12 LAB — COMPREHENSIVE METABOLIC PANEL
ALT: 29 IU/L (ref 0–44)
AST: 30 IU/L (ref 0–40)
Albumin/Globulin Ratio: 1.6 (ref 1.2–2.2)
Albumin: 4.6 g/dL (ref 3.8–4.9)
Alkaline Phosphatase: 76 IU/L (ref 44–121)
BUN/Creatinine Ratio: 15 (ref 9–20)
BUN: 15 mg/dL (ref 6–24)
Bilirubin Total: 0.7 mg/dL (ref 0.0–1.2)
CO2: 21 mmol/L (ref 20–29)
Calcium: 9.3 mg/dL (ref 8.7–10.2)
Chloride: 104 mmol/L (ref 96–106)
Creatinine, Ser: 1 mg/dL (ref 0.76–1.27)
Globulin, Total: 2.9 g/dL (ref 1.5–4.5)
Glucose: 100 mg/dL — ABNORMAL HIGH (ref 70–99)
Potassium: 4.3 mmol/L (ref 3.5–5.2)
Sodium: 139 mmol/L (ref 134–144)
Total Protein: 7.5 g/dL (ref 6.0–8.5)
eGFR: 91 mL/min/{1.73_m2} (ref >59)

## 2021-06-12 LAB — LIPID PANEL
Chol/HDL Ratio: 7.2 ratio — ABNORMAL HIGH (ref 0.0–5.0)
Cholesterol, Total: 288 mg/dL — ABNORMAL HIGH (ref 100–199)
HDL: 40 mg/dL (ref >39)
LDL Chol Calc (NIH): 209 mg/dL — ABNORMAL HIGH (ref 0–99)
Triglycerides: 200 mg/dL — ABNORMAL HIGH (ref 0–149)
VLDL Cholesterol Cal: 39 mg/dL (ref 5–40)

## 2021-06-12 LAB — TSH: TSH: 7.02 u[IU]/mL — ABNORMAL HIGH (ref 0.450–4.500)

## 2021-06-12 LAB — CARDIOVASCULAR RISK ASSESSMENT

## 2021-06-12 MED ORDER — LEVOTHYROXINE SODIUM 75 MCG PO TABS: 75.0000 ug | ORAL_TABLET | Freq: Every day | ORAL | 3 refills | Status: DC

## 2021-06-12 MED ORDER — ATORVASTATIN CALCIUM 20 MG PO TABS: 20.0000 mg | ORAL_TABLET | Freq: Every day | ORAL | 3 refills | Status: DC

## 2021-07-22 ENCOUNTER — Encounter: Payer: Self-pay | Admitting: Physician Assistant

## 2021-07-24 ENCOUNTER — Other Ambulatory Visit: Payer: Managed Care, Other (non HMO)

## 2021-07-24 DIAGNOSIS — E038 Other specified hypothyroidism: Secondary | ICD-10-CM | POA: Diagnosis not present

## 2021-07-24 DIAGNOSIS — E782 Mixed hyperlipidemia: Secondary | ICD-10-CM

## 2021-07-25 LAB — COMPREHENSIVE METABOLIC PANEL
ALT: 20 IU/L (ref 0–44)
AST: 27 IU/L (ref 0–40)
Albumin/Globulin Ratio: 1.4 (ref 1.2–2.2)
Albumin: 4.4 g/dL (ref 3.8–4.9)
Alkaline Phosphatase: 77 IU/L (ref 44–121)
BUN/Creatinine Ratio: 14 (ref 9–20)
BUN: 14 mg/dL (ref 6–24)
Bilirubin Total: 0.5 mg/dL (ref 0.0–1.2)
CO2: 21 mmol/L (ref 20–29)
Calcium: 9.3 mg/dL (ref 8.7–10.2)
Chloride: 103 mmol/L (ref 96–106)
Creatinine, Ser: 1.02 mg/dL (ref 0.76–1.27)
Globulin, Total: 3.1 g/dL (ref 1.5–4.5)
Glucose: 103 mg/dL — ABNORMAL HIGH (ref 70–99)
Potassium: 4.4 mmol/L (ref 3.5–5.2)
Sodium: 140 mmol/L (ref 134–144)
Total Protein: 7.5 g/dL (ref 6.0–8.5)
eGFR: 89 mL/min/{1.73_m2} (ref >59)

## 2021-07-25 LAB — CARDIOVASCULAR RISK ASSESSMENT

## 2021-07-25 LAB — LIPID PANEL
Chol/HDL Ratio: 6.5 ratio — ABNORMAL HIGH (ref 0.0–5.0)
Cholesterol, Total: 297 mg/dL — ABNORMAL HIGH (ref 100–199)
HDL: 46 mg/dL (ref >39)
LDL Chol Calc (NIH): 211 mg/dL — ABNORMAL HIGH (ref 0–99)
Triglycerides: 205 mg/dL — ABNORMAL HIGH (ref 0–149)
VLDL Cholesterol Cal: 40 mg/dL (ref 5–40)

## 2021-07-25 LAB — TSH: TSH: 5 u[IU]/mL — ABNORMAL HIGH (ref 0.450–4.500)

## 2021-07-29 ENCOUNTER — Encounter (HOSPITAL_COMMUNITY): Payer: Self-pay

## 2021-07-29 ENCOUNTER — Ambulatory Visit (HOSPITAL_COMMUNITY)
Admission: EM | Admit: 2021-07-29 | Discharge: 2021-07-29 | Disposition: A | Discharge status: Home / Self Care | Payer: Managed Care, Other (non HMO) | Attending: Family Medicine | Admitting: Family Medicine

## 2021-07-29 DIAGNOSIS — L0231 Cutaneous abscess of buttock: Secondary | ICD-10-CM

## 2021-07-29 MED ORDER — HYDROCODONE-ACETAMINOPHEN 5-325 MG PO TABS: 1.0000 | ORAL_TABLET | Freq: Four times a day (QID) | ORAL | 0 refills | Status: DC | PRN

## 2021-07-29 MED ORDER — DOXYCYCLINE HYCLATE 100 MG PO CAPS: 100.0000 mg | ORAL_CAPSULE | Freq: Two times a day (BID) | ORAL | 0 refills | Status: DC

## 2021-07-29 NOTE — ED Triage Notes (Signed)
Pt presents with c/o abscess on the R hip x 6 days.  ?
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
## 2021-09-14 ENCOUNTER — Other Ambulatory Visit: Payer: Self-pay | Admitting: Physician Assistant

## 2021-09-14 DIAGNOSIS — E782 Mixed hyperlipidemia: Secondary | ICD-10-CM

## 2021-09-14 MED ORDER — ATORVASTATIN CALCIUM 20 MG PO TABS: 20.0000 mg | ORAL_TABLET | Freq: Every day | ORAL | 3 refills | Status: DC

## 2021-10-09 DIAGNOSIS — R1010 Upper abdominal pain, unspecified: Secondary | ICD-10-CM | POA: Diagnosis not present

## 2021-10-09 DIAGNOSIS — R1013 Epigastric pain: Secondary | ICD-10-CM | POA: Diagnosis not present

## 2021-10-09 LAB — COMPREHENSIVE METABOLIC PANEL
Albumin: 4.6 (ref 3.5–5.0)
Calcium: 9.5 (ref 8.7–10.7)

## 2021-10-09 LAB — BASIC METABOLIC PANEL
BUN: 17 (ref 4–21)
CO2: 21 (ref 13–22)
Chloride: 105 (ref 99–108)
Creatinine: 0.7 (ref 0.6–1.3)
Glucose: 107
Potassium: 4.3 mEq/L (ref 3.5–5.1)
Sodium: 139 (ref 137–147)

## 2021-10-09 LAB — HEPATIC FUNCTION PANEL
ALT: 31 U/L (ref 10–40)
AST: 35 (ref 14–40)
Alkaline Phosphatase: 84 (ref 25–125)

## 2021-10-30 ENCOUNTER — Ambulatory Visit (INDEPENDENT_AMBULATORY_CARE_PROVIDER_SITE_OTHER): Payer: Managed Care, Other (non HMO) | Admitting: Family Medicine

## 2021-10-30 ENCOUNTER — Encounter: Payer: Self-pay | Admitting: Family Medicine

## 2021-10-30 DIAGNOSIS — A048 Other specified bacterial intestinal infections: Secondary | ICD-10-CM | POA: Diagnosis not present

## 2021-10-30 MED ORDER — PANTOPRAZOLE SODIUM 40 MG PO TBEC: 40.0000 mg | DELAYED_RELEASE_TABLET | Freq: Two times a day (BID) | ORAL | 0 refills | Status: DC

## 2021-10-30 MED ORDER — AMOXICILLIN 500 MG PO CAPS: 1000.0000 mg | ORAL_CAPSULE | Freq: Two times a day (BID) | ORAL | 0 refills | Status: AC

## 2021-10-30 MED ORDER — EZETIMIBE 10 MG PO TABS: 10.0000 mg | ORAL_TABLET | Freq: Every day | ORAL | 0 refills | Status: DC

## 2021-10-30 MED ORDER — CLARITHROMYCIN 500 MG PO TABS: 500.0000 mg | ORAL_TABLET | Freq: Two times a day (BID) | ORAL | 0 refills | Status: DC
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
## 2021-10-30 NOTE — Assessment & Plan Note (Signed)
PPI (esomeprazole 20 mg, lansoprazole 30 mg, pantoprazole 40 mg, omeprazole 40 mg, or rabeprazole 20 mg, all given bid), with twice-daily clarithromycin (500 mg), amoxicillin (1 g bid),  14 days.

## 2021-11-02 ENCOUNTER — Encounter: Payer: Self-pay | Admitting: Physician Assistant

## 2021-11-21 ENCOUNTER — Other Ambulatory Visit: Payer: Self-pay | Admitting: Family Medicine

## 2021-12-11 ENCOUNTER — Ambulatory Visit: Payer: Managed Care, Other (non HMO) | Admitting: Physician Assistant

## 2022-01-06 ENCOUNTER — Encounter: Payer: Self-pay | Admitting: Legal Medicine

## 2022-01-06 ENCOUNTER — Ambulatory Visit: Payer: Managed Care, Other (non HMO) | Admitting: Legal Medicine

## 2022-01-06 VITALS — BP 110/80 | HR 104 | Temp 97.8°F | Resp 15 | Ht 64.0 in | Wt 165.0 lb

## 2022-01-06 DIAGNOSIS — E782 Mixed hyperlipidemia: Secondary | ICD-10-CM | POA: Diagnosis not present

## 2022-01-06 DIAGNOSIS — E038 Other specified hypothyroidism: Secondary | ICD-10-CM | POA: Diagnosis not present

## 2022-01-06 DIAGNOSIS — N400 Enlarged prostate without lower urinary tract symptoms: Secondary | ICD-10-CM | POA: Diagnosis not present

## 2022-01-06 DIAGNOSIS — K2100 Gastro-esophageal reflux disease with esophagitis, without bleeding: Secondary | ICD-10-CM | POA: Diagnosis not present
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
## 2022-01-07 ENCOUNTER — Other Ambulatory Visit: Payer: Self-pay

## 2022-01-07 ENCOUNTER — Other Ambulatory Visit: Payer: Self-pay | Admitting: Legal Medicine

## 2022-01-07 DIAGNOSIS — E038 Other specified hypothyroidism: Secondary | ICD-10-CM

## 2022-01-07 DIAGNOSIS — E782 Mixed hyperlipidemia: Secondary | ICD-10-CM

## 2022-01-07 LAB — COMPREHENSIVE METABOLIC PANEL
ALT: 24 IU/L (ref 0–44)
AST: 24 IU/L (ref 0–40)
Albumin/Globulin Ratio: 1.8 (ref 1.2–2.2)
Albumin: 4.5 g/dL (ref 3.8–4.9)
Alkaline Phosphatase: 78 IU/L (ref 44–121)
BUN/Creatinine Ratio: 18 (ref 9–20)
BUN: 17 mg/dL (ref 6–24)
Bilirubin Total: 0.4 mg/dL (ref 0.0–1.2)
CO2: 19 mmol/L — ABNORMAL LOW (ref 20–29)
Calcium: 9.3 mg/dL (ref 8.7–10.2)
Chloride: 102 mmol/L (ref 96–106)
Creatinine, Ser: 0.95 mg/dL (ref 0.76–1.27)
Globulin, Total: 2.5 g/dL (ref 1.5–4.5)
Glucose: 101 mg/dL — ABNORMAL HIGH (ref 70–99)
Potassium: 4 mmol/L (ref 3.5–5.2)
Sodium: 135 mmol/L (ref 134–144)
Total Protein: 7 g/dL (ref 6.0–8.5)
eGFR: 96 mL/min/{1.73_m2} (ref >59)

## 2022-01-07 LAB — CBC WITH DIFFERENTIAL/PLATELET
Basophils Absolute: 0.1 x10E3/uL (ref 0.0–0.2)
Basos: 1 %
EOS (ABSOLUTE): 0.1 x10E3/uL (ref 0.0–0.4)
Eos: 2 %
Hematocrit: 42.6 % (ref 37.5–51.0)
Hemoglobin: 14 g/dL (ref 13.0–17.7)
Immature Grans (Abs): 0.1 x10E3/uL (ref 0.0–0.1)
Immature Granulocytes: 1 %
Lymphocytes Absolute: 2.4 x10E3/uL (ref 0.7–3.1)
Lymphs: 38 %
MCH: 29.2 pg (ref 26.6–33.0)
MCHC: 32.9 g/dL (ref 31.5–35.7)
MCV: 89 fL (ref 79–97)
Monocytes Absolute: 0.4 x10E3/uL (ref 0.1–0.9)
Monocytes: 7 %
Neutrophils Absolute: 3.2 x10E3/uL (ref 1.4–7.0)
Neutrophils: 51 %
Platelets: 288 x10E3/uL (ref 150–450)
RBC: 4.8 x10E6/uL (ref 4.14–5.80)
RDW: 13.1 % (ref 11.6–15.4)
WBC: 6.3 x10E3/uL (ref 3.4–10.8)

## 2022-01-07 LAB — PSA: Prostate Specific Ag, Serum: 1.1 ng/mL (ref 0.0–4.0)

## 2022-01-07 LAB — TSH: TSH: 6.57 u[IU]/mL — ABNORMAL HIGH (ref 0.450–4.500)

## 2022-01-07 LAB — LIPID PANEL
Chol/HDL Ratio: 11.5 ratio — ABNORMAL HIGH (ref 0.0–5.0)
Cholesterol, Total: 287 mg/dL — ABNORMAL HIGH (ref 100–199)
HDL: 25 mg/dL — ABNORMAL LOW (ref >39)
Triglycerides: 1181 mg/dL (ref 0–149)

## 2022-01-07 LAB — T4, FREE: Free T4: 1.4 ng/dL (ref 0.82–1.77)

## 2022-01-07 LAB — CARDIOVASCULAR RISK ASSESSMENT

## 2022-01-07 MED ORDER — LEVOTHYROXINE SODIUM 88 MCG PO TABS: 88.0000 ug | ORAL_TABLET | Freq: Every day | ORAL | 3 refills | Status: DC

## 2022-01-07 NOTE — Progress Notes (Signed)
Triglyceride 1, 181 extremely high, unable to calculate cholesterol, refer to cardiology for lipid management, glucose 101, kidney tests normal, liver tests normal, TSH 6.570 increase thyroid to , CBC normal, retest TSH in 6 weeks lp

## 2022-01-09 NOTE — Progress Notes (Signed)
Free T4 1.40,normal, PSA 1.1 normal

## 2022-01-20 ENCOUNTER — Other Ambulatory Visit: Payer: Self-pay | Admitting: Family Medicine

## 2022-03-01 ENCOUNTER — Ambulatory Visit: Payer: No Typology Code available for payment source | Admitting: Internal Medicine

## 2022-03-16 ENCOUNTER — Ambulatory Visit: Payer: No Typology Code available for payment source | Attending: Internal Medicine | Admitting: Internal Medicine

## 2022-03-16 ENCOUNTER — Encounter: Payer: Self-pay | Admitting: Internal Medicine

## 2022-03-16 ENCOUNTER — Telehealth: Payer: Self-pay | Admitting: *Deleted

## 2022-03-16 ENCOUNTER — Telehealth: Payer: Self-pay

## 2022-03-16 ENCOUNTER — Encounter: Payer: Self-pay | Admitting: Physician Assistant

## 2022-03-16 VITALS — BP 120/78 | HR 81 | Ht 64.0 in | Wt 159.0 lb

## 2022-03-16 DIAGNOSIS — R0683 Snoring: Secondary | ICD-10-CM

## 2022-03-16 DIAGNOSIS — E038 Other specified hypothyroidism: Secondary | ICD-10-CM | POA: Diagnosis not present

## 2022-03-16 DIAGNOSIS — E781 Pure hyperglyceridemia: Secondary | ICD-10-CM | POA: Diagnosis not present

## 2022-03-16 DIAGNOSIS — G4719 Other hypersomnia: Secondary | ICD-10-CM | POA: Diagnosis not present

## 2022-03-16 MED ORDER — ATORVASTATIN CALCIUM 40 MG PO TABS: 40.0000 mg | ORAL_TABLET | Freq: Every day | ORAL | 3 refills | Status: DC

## 2022-03-16 NOTE — Patient Instructions (Signed)
Medication Instructions:  START atorvastatin 40mg  daily   *If you need a refill on your cardiac medications before your next appointment, please call your pharmacy*   Lab Work: FASTING lab test in 3-4 months  If you have labs (blood work) drawn today and your tests are completely normal, you will receive your results only by: MyChart Message (if you have MyChart) OR A paper copy in the mail If you have any lab test that is abnormal or we need to change your treatment, we will call you to review the results.   Testing/Procedures:  Dr. has ordered a home sleep test.   WatchPAT?  Is a FDA cleared portable home sleep study test that uses a watch and 3 points of contact to monitor 7 different channels, including your heart rate, oxygen saturations, body position, snoring, and chest motion.  The study is easy to use from the comfort of your own home and accurately detect sleep apnea.  Before bed, you attach the chest sensor, attached the sleep apnea bracelet to your nondominant hand, and attach the finger probe.  After the study, the raw data is downloaded from the watch and scored for apnea events.   For more information: [email or web address]  Patient Testing Instructions:  Do not put battery into the device until bedtime when you are ready to begin the test. Please call the support number if you need assistance after following the instructions below: 24 hour support line- [phone] or ITAMAR support at [phone] (option 2)  Download the [phone] One" app through the google play store or App Store  Be sure to turn on or enable access to bluetooth in settlings on your smartphone/ device  Make sure no other bluetooth devices are on and within the vicinity of your smartphone/ device and WatchPAT watch during testing.  Make sure to leave your smart phone/ device plugged in and charging all night.  When ready for bed:  Follow the instructions step by step  in the WatchPAT One App to activate the testing device. For additional instructions, including video instruction, visit the WatchPAT One video on Youtube. You can search for WatchPat One within Youtube (video is 4 minutes and 18 seconds) or enter: https://youtube/watch?[email or web address]_vbiwxE Please note: You will be prompted to enter a Pin to connect via bluetooth when starting the test. The PIN will be assigned to you when you receive the test.  The device is disposable, but it recommended that you retain the device until you receive a call letting you know the study has been received and the results have been interpreted.  We will let you know if the study did not transmit to [employer] properly after the test is completed. You do not need to call us to confirm the receipt of the test.  Please complete the test within 48 hours of receiving PIN.   Frequently Asked Questions:  What is Watch [place] one?  A single use fully disposable home sleep apnea testing device and will not need to be returned after completion.  What are the requirements to use WatchPAT one?  The be able to have a successful watchpat one sleep study, you should have your Watch pat one device, your smart phone, watch pat one app, your PIN number and Internet access What type of phone do I need?  You should have a smart phone that uses Android 5.1 and above or any Iphone with IOS 10 and above How can I download the WatchPAT one app?  Based on  your device type search for WatchPAT one app either in google play for android devices or APP store for Iphone's Where will I get my PIN for the study?  Your PIN will be provided by your physician's office. It is used for authentication and if you lose/forget your PIN, please reach out to your providers office.  I do not have Internet at home. Can I do WatchPAT one study?  WatchPAT One needs Internet connection throughout the night to be able to transmit the sleep data. You can use your home/local internet or  your cellular's data package. However, it is always recommended to use home/local Internet. It is estimated that between 20MB-30MB will be used with each study.However, the application will be looking for space in the phone to start the study.  What happens if I lose internet or bluetooth connection?  During the internet disconnection, your phone will not be able to transmit the sleep data. All the data, will be stored in your phone. As soon as the internet connection is back on, the phone will being sending the sleep data. During the bluetooth disconnection, WatchPAT one will not be able to to send the sleep data to your phone. Data will be kept in the [facility] one until two devices have bluetooth connection back on. As soon as the connection is back on, WatchPAT one will send the sleep data to the phone.  How long do I need to wear the WatchPAT one?  After you start the study, you should wear the device at least 6 hours.  How far should I keep my phone from the device?  During the night, your phone should be within 15 feet.  What happens if I leave the room for restroom or other reasons?  Leaving the room for any reason will not cause any problem. As soon as your get back to the room, both devices will reconnect and will continue to send the sleep data. Can I use my phone during the sleep study?  Yes, you can use your phone as usual during the study. But it is recommended to put your watchpat one on when you are ready to go to bed.  How will I get my study results?  A soon as you completed your study, your sleep data will be sent to the provider. They will then share the results with you when they are ready.    Dr. Rennis Golden has ordered a CT coronary calcium score.   Test locations:  [facility]   This is $99 out of pocket.   Coronary CalciumScan A coronary calcium scan is an imaging test used to look for deposits of calcium and other fatty materials  (plaques) in the inner lining of the blood vessels of the heart (coronary arteries). These deposits of calcium and plaques can partly clog and narrow the coronary arteries without producing any symptoms or warning signs. This puts a person at risk for a heart attack. This test can detect these deposits before symptoms develop. Tell a health care provider about: Any allergies you have. All medicines you are taking, including vitamins, herbs, eye drops, creams, and over-the-counter medicines. Any problems you or family members have had with anesthetic medicines. Any blood disorders you have. Any surgeries you have had. Any medical conditions you have. Whether you are pregnant or may be pregnant. What are the risks? Generally, this is a safe procedure. However, problems may occur, including: Harm to a pregnant woman and her unborn  baby. This test involves the use of radiation. Radiation exposure can be dangerous to a pregnant woman and her unborn baby. If you are pregnant, you generally should not have this procedure done. Slight increase in the risk of cancer. This is because of the radiation involved in the test. What happens before the procedure? No preparation is needed for this procedure. What happens during the procedure? You will undress and remove any jewelry around your neck or chest. You will put on a hospital gown. Sticky electrodes will be placed on your chest. The electrodes will be connected to an electrocardiogram (ECG) machine to record a tracing of the electrical activity of your heart. A CT scanner will take pictures of your heart. During this time, you will be asked to lie still and hold your breath for 2-3 seconds while a picture of your heart is being taken. The procedure may vary among health care providers and hospitals. What happens after the procedure? You can get dressed. You can return to your normal activities. It is up to you to get the results of your test. Ask your  health care provider, or the department that is doing the test, when your results will be ready. Summary A coronary calcium scan is an imaging test used to look for deposits of calcium and other fatty materials (plaques) in the inner lining of the blood vessels of the heart (coronary arteries). Generally, this is a safe procedure. Tell your health care provider if you are pregnant or may be pregnant. No preparation is needed for this procedure. A CT scanner will take pictures of your heart. You can return to your normal activities after the scan is done. This information is not intended to replace advice given to you by your health care provider. Make sure you discuss any questions you have with your health care provider. Document Released: 10/16/2007 Document Revised: 03/08/2016 Document Reviewed: 03/08/2016 Elsevier Interactive Patient Education  2017 [employer].    Follow-Up: At [facility], you and your health needs are our priority.  As part of our continuing mission to provide you with exceptional heart care, we have created designated Provider Care Teams.  These Care Teams include your primary Cardiologist (physician) and Advanced Practice Providers (APPs -  Physician Assistants and Nurse Practitioners) who all work together to provide you with the care you need, when you need it.  We recommend signing up for the patient portal called "MyChart".  Sign up information is provided on this After Visit Summary.  MyChart is used to connect with patients for Virtual Visits (Telemedicine).  Patients are able to view lab/test results, encounter notes, upcoming appointments, etc.  Non-urgent messages can be sent to your provider as well.   To learn more about what you can do with MyChart, go to [email or web address].    Your next appointment:    3-4 months with Dr. Rennis Golden
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
## 2022-03-16 NOTE — Telephone Encounter (Signed)
I CALLED THE PATIENT TO SPEAK WITH HIM ABOUT THE ITAMAR DEVICE. PATIENT ANSWERED AND STATED HE WAS WORKING AND HUNG THE PHONE UP. I WILL CALL THE PATIENT BACK LATER.

## 2022-03-16 NOTE — Telephone Encounter (Signed)
Notified Stacy Green ok to activate itamar device.  

## 2022-03-17 ENCOUNTER — Telehealth: Payer: Self-pay

## 2022-03-17 NOTE — Telephone Encounter (Signed)
I called the Interpreter ID [phone]  to speak with the patient.  Called and made the patient aware that He may proceed with the [facility] Sleep Study. PIN # provided to the patient. Patient made aware that He will be contacted after the test has been read with the results and any recommendations. Patient verbalized understanding and thanked me for the call.

## 2022-03-20 ENCOUNTER — Encounter (HOSPITAL_BASED_OUTPATIENT_CLINIC_OR_DEPARTMENT_OTHER): Payer: No Typology Code available for payment source | Admitting: Cardiology

## 2022-03-20 DIAGNOSIS — G4733 Obstructive sleep apnea (adult) (pediatric): Secondary | ICD-10-CM

## 2022-03-21 NOTE — Procedures (Signed)
SLEEP STUDY REPORT Patient Information Study Date: 03/20/22 Patient Name: Garrett Stone Patient ID: [id number] Birth Date: 1970/11/28 Age: 51 Gender: Male BMI: 27.1 (W=159 lb, H=5' 4'') Referring Physician: K. [place] Hilty, MD  TEST DESCRIPTION:  Home sleep apnea testing was completed using the WatchPat, a Type 1 device, utilizing peripheral arterial tonometry (PAT), chest movement, actigraphy, pulse oximetry, pulse rate, body position and snore.  AHI was calculated with apnea and hypopnea using valid sleep time as the denominator. RDI includes apneas, hypopneas, and RERAs.  The data acquired and the scoring of sleep and all associated events were performed in accordance with the recommended standards and specifications as outlined in the AASM Manual for the Scoring of Sleep and Associated Events 2.2.0 (2015).  FINDINGS:  1.  Moderate Obstructive Sleep Apnea with AHI 23.6/hr.   2.  No significant Central Sleep Apnea with pAHIc 5.7/hr.  3.  Oxygen desaturations as low as 79%.  4.  Mild snoring was present. O2 sats were < 88% for 2.5 min.  5.  Total sleep time was 6 hrs and 41 min.  6.  26.6% of total sleep time was spent in REM sleep.   7.  Shortened sleep onset latency at 5 min  8.  Shoretned REM sleep onset latency at 58 min.   9.  Total awakenings were 18.  10. Arrhythmia detection:  None.  DIAGNOSIS:   Moderate Obstructive Sleep Apnea (G47.33)  RECOMMENDATIONS:   1.  Clinical correlation of these findings is necessary.  The decision to treat obstructive sleep apnea (OSA) is usually based on the presence of apnea symptoms or the presence of associated medical conditions such as Hypertension, Congestive Heart Failure, Atrial Fibrillation or Obesity.  The most common symptoms of OSA are snoring, gasping for breath while sleeping, daytime sleepiness and fatigue.   2.  Initiating apnea therapy is recommended given the presence of symptoms and/or associated conditions.  Recommend proceeding with one of the following:     a.  Auto-CPAP therapy with a pressure range of 5-20cm H2O.     b.  An oral appliance (OA) that can be obtained from certain dentists with expertise in sleep medicine.  These are primarily of use in non-obese patients with mild and moderate disease.     c.  An ENT consultation which may be useful to look for specific causes of obstruction and possible treatment options.     d.  If patient is intolerant to PAP therapy, consider referral to ENT for evaluation for hypoglossal nerve stimulator.   3.  Close follow-up is necessary to ensure success with CPAP or oral appliance therapy for maximum benefit.  4.  A follow-up oximetry study on CPAP is recommended to assess the adequacy of therapy and determine the need for supplemental oxygen or the potential need for Bi-level therapy.  An arterial blood gas to determine the adequacy of baseline ventilation and oxygenation should also be considered.  5.  Healthy sleep recommendations include:  adequate nightly sleep (normal 7-9 hrs/night), avoidance of caffeine after noon and alcohol near bedtime, and maintaining a sleep environment that is cool, dark and quiet.  6.  Weight loss for overweight patients is recommended.  Even modest amounts of weight loss can significantly improve the severity of sleep apnea.  7.  Snoring recommendations include:  weight loss where appropriate, side sleeping, and avoidance of alcohol before bed.  8.  Operation of motor vehicle should not be performed when sleepy.  Signature:   Armanda Magic, MD; [facility]; Diplomat, American Board of Sleep Medicine Electronically Signed: 03/21/2022

## 2022-03-22 ENCOUNTER — Ambulatory Visit: Payer: No Typology Code available for payment source | Attending: Internal Medicine

## 2022-03-22 DIAGNOSIS — R0683 Snoring: Secondary | ICD-10-CM

## 2022-03-22 DIAGNOSIS — G4719 Other hypersomnia: Secondary | ICD-10-CM

## 2022-03-24 ENCOUNTER — Other Ambulatory Visit: Payer: Self-pay | Admitting: Cardiology

## 2022-03-24 DIAGNOSIS — G4733 Obstructive sleep apnea (adult) (pediatric): Secondary | ICD-10-CM

## 2022-03-31 ENCOUNTER — Telehealth: Payer: Self-pay | Admitting: *Deleted

## 2022-03-31 NOTE — Telephone Encounter (Signed)
Received automated call from [facility]) denying CPAP titration. APAP request will be sent to [facility] per Dr Malachy Mood orders.

## 2022-04-07 ENCOUNTER — Other Ambulatory Visit: Payer: Self-pay | Admitting: Cardiology

## 2022-04-07 ENCOUNTER — Telehealth: Payer: Self-pay

## 2022-04-07 DIAGNOSIS — G4733 Obstructive sleep apnea (adult) (pediatric): Secondary | ICD-10-CM

## 2022-04-07 NOTE — Telephone Encounter (Signed)
Called patient with results of sleep study. Patient had understanding of results.

## 2022-04-10 ENCOUNTER — Other Ambulatory Visit: Payer: Self-pay | Admitting: Legal Medicine

## 2022-04-10 DIAGNOSIS — E038 Other specified hypothyroidism: Secondary | ICD-10-CM

## 2022-04-10 MED ORDER — LEVOTHYROXINE SODIUM 88 MCG PO TABS: 88.0000 ug | ORAL_TABLET | Freq: Every day | ORAL | 3 refills | Status: DC

## 2022-04-12 ENCOUNTER — Other Ambulatory Visit: Payer: Self-pay | Admitting: Cardiology

## 2022-04-12 ENCOUNTER — Telehealth: Payer: Self-pay

## 2022-04-12 DIAGNOSIS — G4733 Obstructive sleep apnea (adult) (pediatric): Secondary | ICD-10-CM

## 2022-04-12 NOTE — Telephone Encounter (Signed)
Call patient to advise CPAP machine will be set up automatically due to  insurance will not pay for doctor to titrate machine. Patient verbally understood what was explained to him.

## 2022-05-07 ENCOUNTER — Ambulatory Visit (HOSPITAL_BASED_OUTPATIENT_CLINIC_OR_DEPARTMENT_OTHER): Admission: RE | Admit: 2022-05-07 | Payer: No Typology Code available for payment source | Source: Ambulatory Visit

## 2022-06-25 ENCOUNTER — Ambulatory Visit: Payer: No Typology Code available for payment source | Attending: Internal Medicine | Admitting: Internal Medicine

## 2022-06-25 ENCOUNTER — Encounter: Payer: Self-pay | Admitting: Internal Medicine

## 2022-06-25 VITALS — BP 126/84 | HR 76 | Ht 64.0 in | Wt 167.0 lb

## 2022-06-25 DIAGNOSIS — E781 Pure hyperglyceridemia: Secondary | ICD-10-CM | POA: Diagnosis not present

## 2022-06-25 DIAGNOSIS — G4733 Obstructive sleep apnea (adult) (pediatric): Secondary | ICD-10-CM

## 2022-06-25 LAB — NMR, LIPOPROFILE
Cholesterol, Total: 304 mg/dL — ABNORMAL HIGH (ref 100–199)
HDL Particle Number: 36.4 umol/L (ref >=30.5)
HDL-C: 43 mg/dL (ref >39)
LDL Particle Number: 2122 nmol/L — ABNORMAL HIGH (ref <1000)
LDL Size: 19.5 nm — ABNORMAL LOW (ref >20.5)
LDL-C (NIH Calc): 173 mg/dL — ABNORMAL HIGH (ref 0–99)
LP-IR Score: 57 — ABNORMAL HIGH (ref <=45)
Small LDL Particle Number: 1464 nmol/L — ABNORMAL HIGH (ref <=527)
Triglycerides: 445 mg/dL — ABNORMAL HIGH (ref 0–149)

## 2022-06-25 LAB — LIPOPROTEIN A (LPA): Lipoprotein (a): 20.1 nmol/L (ref <75.0)

## 2022-06-25 LAB — APOLIPOPROTEIN B: Apolipoprotein B: 152 mg/dL — ABNORMAL HIGH (ref <90)

## 2022-06-25 MED ORDER — EZETIMIBE 10 MG PO TABS: 10.0000 mg | ORAL_TABLET | Freq: Every day | ORAL | 3 refills | Status: DC
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
## 2022-06-25 NOTE — Patient Instructions (Signed)
Medication Instructions:  Continue ZETIA and ATORVASTATIN   *If you need a refill on your cardiac medications before your next appointment, please call your pharmacy*   Lab Work: FASTING lab work in 3-4 months -- complete before next appointment   If you have labs (blood work) drawn today and your tests are completely normal, you will receive your results only by: [place] (if you have MyChart) OR A paper copy in the mail If you have any lab test that is abnormal or we need to change your treatment, we will call you to review the results.    Follow-Up: At [facility], you and your health needs are our priority.  As part of our continuing mission to provide you with exceptional heart care, we have created designated Provider Care Teams.  These Care Teams include your primary Cardiologist (physician) and Advanced Practice Providers (APPs -  Physician Assistants and Nurse Practitioners) who all work together to provide you with the care you need, when you need it.  We recommend signing up for the patient portal called "MyChart".  Sign up information is provided on this After Visit Summary.  MyChart is used to connect with patients for Virtual Visits (Telemedicine).  Patients are able to view lab/test results, encounter notes, upcoming appointments, etc.  Non-urgent messages can be sent to your provider as well.   To learn more about what you can do with MyChart, go to [email or web address].    Your next appointment:    3-4 months with Dr. Debara Pickett

## 2022-07-16 ENCOUNTER — Ambulatory Visit (INDEPENDENT_AMBULATORY_CARE_PROVIDER_SITE_OTHER): Payer: No Typology Code available for payment source | Admitting: Nurse Practitioner

## 2022-07-16 ENCOUNTER — Ambulatory Visit: Payer: Managed Care, Other (non HMO) | Admitting: Physician Assistant

## 2022-07-16 ENCOUNTER — Encounter: Payer: Self-pay | Admitting: Nurse Practitioner

## 2022-07-16 VITALS — BP 120/80 | HR 75 | Temp 97.1°F | Resp 12 | Ht 64.0 in | Wt 161.0 lb

## 2022-07-16 DIAGNOSIS — E782 Mixed hyperlipidemia: Secondary | ICD-10-CM | POA: Diagnosis not present

## 2022-07-16 DIAGNOSIS — K2100 Gastro-esophageal reflux disease with esophagitis, without bleeding: Secondary | ICD-10-CM | POA: Diagnosis not present

## 2022-07-16 DIAGNOSIS — E038 Other specified hypothyroidism: Secondary | ICD-10-CM

## 2022-07-16 NOTE — Assessment & Plan Note (Addendum)
Continue Levothyroxine 88 mcg first thing in the morning Advised not to miss any days, even overslept he still can take it first thing Labs drawn today and we will adjust the dose if needed

## 2022-07-16 NOTE — Patient Instructions (Addendum)
Follow up in 4 months  Dyslipidemia La dislipidemia es un desequilibrio de sustancias cerosas parecidas a la grasa (lpidos) en la sangre. El cuerpo necesita lpidos en pequeas cantidades. Con frecuencia, la dislipidemia implica un nivel alto de colesterol o triglicridos, que son tipos de lpidos. Las formas frecuentes de dislipidemia incluyen las siguientes: Niveles elevados de colesterol LDL. El LDL es el tipo de colesterol que causa la acumulacin de depsitos de grasa (placas) en los vasos sanguneos que transportan la sangre fuera del corazn (arterias). Niveles bajos de colesterol HDL. El HDL es el tipo de colesterol que brinda proteccin contra las enfermedades cardacas. Los niveles altos de HDL eliminan la acumulacin de LDL de las arterias. Niveles altos de triglicridos. Los triglicridos son Ardelia Mems sustancia grasa presente en la sangre que se relaciona con la acumulacin de placa en las arterias. Cules son las causas? Hay dos tipos principales de dislipidemia: primaria y [place]. La dislipidemia primaria es causada por cambios (mutaciones) en los genes que se transmiten a travs de las familias (se heredan). Estas mutaciones causan varios tipos de dislipidemia. La dislipidemia secundaria puede ser causada por diversos factores de riesgo que pueden provocar la enfermedad, como las opciones de estilo de vida y Actuary. Qu incrementa el riesgo? Tiene ms probabilidades de Armed forces training and education officer afeccin si es un hombre mayor o si es una mujer que ha pasado por la menopausia. Otros factores de riesgo son los siguientes: Tener antecedentes familiares de dislipidemia. Tomar determinados medicamentos, entre ellos, pldoras anticonceptivas, corticoesteroides, algunos diurticos y betabloqueantes. Seguir una dieta con alto contenido de grasas saturadas. Fumar cigarrillos o beber alcohol en exceso. Tener ciertas afecciones mdicas, como diabetes, sndrome de ovario poliqustico (SOP),  enfermedad renal, enfermedad heptica o hipotiroidismo. No hacer ejercicio regularmente. Tener sobrepeso o ser obeso con demasiada grasa en el abdomen. Cules son los signos o sntomas? En la [employer], la dislipidemia no causa ningn sntoma. En los casos graves, los niveles muy altos de lpidos pueden causar: Protuberancias de grasa debajo de la piel (xantomas). Un anillo blanco o gris alrededor del centro negro (pupila) del ojo. Los niveles muy altos de triglicridos pueden causar inflamacin del pncreas (pancreatitis). Cmo se diagnostica? Su mdico puede diagnosticar dislipidemia basndose en un anlisis de sangre de rutina (anlisis de sangre en [place]). Como la [employer] de las personas no tienen sntomas de la afeccin, este anlisis de sangre (perfil de lpidos) se realiza en adultos mayores de 2 aos y se repite cada 4 a 6 aos. En este anlisis, se controla lo siguiente: Colesterol total. Esto mide la cantidad total de colesterol en la sangre, que incluye el colesterol LDL, el colesterol HDL y los triglicridos. Un valor saludable est por debajo de 200 mg/dl (5.17 mmol/l). Colesterol LDL. El valor objetivo de colesterol LDL es diferente para cada persona, en funcin de los factores de riesgo individuales. Un valor saludable suele estar por debajo de 100 mg/dl (2.59 mmol/l). Consulte al mdico cul debe ser el valor del colesterol LDL para usted. Colesterol HDL. Un nivel de colesterol HDL de 60 mg/dl (1.55 mmol/l) o superior es lo mejor porque ayuda a Tour manager las enfermedades cardacas. Un valor por debajo de 40 mg/dl (1.03 mmol/l) para los hombres o por debajo de 50 mg/dl (1.29 mmol/l) para las mujeres aumenta el riesgo de enfermedad cardaca. Triglicridos. Un valor de triglicridos saludable est por debajo de 150 mg/dl (1.69 mmol/l). Si su perfil de lpidos es anormal, su mdico puede realizar otros anlisis de [place].  Cmo se trata? El tratamiento depende del  tipo de dislipidemia que usted tenga y sus otros factores de riesgo de enfermedades cardacas o accidente cerebrovascular. Su mdico tendr un rango objetivo para sus niveles de lpidos en funcin de esta informacin. El tratamiento para la dislipidemia comienza con cambios en el estilo de vida, tales como dieta y ejercicio. El mdico podra recomendarle que haga lo siguiente: Hacer ejercicio con regularidad. Realizar cambios en la dieta. Si fuma, dejar de hacerlo. Limitar el consumo de bebidas alcohlicas. Si los cambios en la dieta y la actividad fsica no ayudan a Science writer sus objetivos, el mdico tambin puede recetarle medicamentos para disminuir los lpidos. El tipo de medicamento recetado con ms frecuencia disminuye el colesterol LDL (estatinas). Si tiene Education officer, environmental de triglicridos, su mdico puede recetarle otro tipo de frmaco (fibratos) o un suplemento de aceite de pescado con omega-3, o ambos. Siga estas instrucciones en su casa: Comida y bebida  Siga las indicaciones del mdico o el nutricionista respecto de las restricciones para las comidas o las bebidas. Siga una dieta saludable como se lo haya indicado el mdico. Esto puede ayudarle a Science writer y Theatre manager un peso saludable, reducir el colesterol LDL y aumentar el colesterol HDL. Puede incluir: Limitar sus caloras, si tiene sobrepeso. Comer ms frutas, verduras, cereales integrales, pescado y carnes magras. Limitar las grasas saturadas, las grasas trans y Freight forwarder. No beba alcohol si: Su mdico le indica no hacerlo. Est embarazada, puede estar embarazada o est tratando de [place]. Si bebe alcohol: Limite la cantidad que bebe a lo siguiente: De 0 a 1 medida por da para las mujeres. De 0 a 2 medidas por da para los hombres. Sepa cunta cantidad de alcohol hay en las bebidas que toma. En los [place], una medida equivale a una botella de cerveza de 12 oz (355 ml), un vaso de vino de 5 oz (148 ml) o un  vaso de una bebida alcohlica de alta graduacin de 1 oz (44 ml). Actividad Haga ejercicio con regularidad. Siga un programa de ejercicio y entrenamiento de fuerza tal como se lo haya indicado el mdico. Pregntele al mdico qu actividades son seguras para usted. El mdico puede recomendarle lo siguiente: 30 minutos de [place] de 4 a 6 das por semana. La caminata a paso ligero es un ejemplo de [place]. Entrenamiento de fuerza 2 [employer]. Indicaciones generales No consuma ningn producto que contenga nicotina o tabaco. Estos productos incluyen cigarrillos, tabaco para Higher education careers adviser y aparatos de vapeo, como los Psychologist, sport and exercise. Si necesita ayuda para dejar de consumir estos productos, consulte al [employer]. Use los medicamentos de venta libre y los recetados solamente como se lo haya indicado el mdico. Esto incluye los suplementos. Concurra a [place]. Esto es importante. Comunquese con un mdico si: Tiene dificultad para cumplir con su plan de actividad fsica o su dieta. Le cuesta dejar de fumar o controlar el consumo de alcohol. Resumen Con frecuencia, la dislipidemia implica un nivel alto de colesterol o triglicridos, que son tipos de lpidos. El tratamiento depende del tipo de dislipidemia que usted tenga y sus otros factores de riesgo de enfermedades cardacas o accidente cerebrovascular. El tratamiento para la dislipidemia comienza con cambios en el estilo de vida, tales como dieta y ejercicio. Su mdico puede recetarle medicamentos para disminuir los lpidos. Esta informacin no tiene Marine scientist el consejo del mdico. Asegrese de hacerle al mdico cualquier pregunta que tenga. Document Revised: 07/04/2020  Document Reviewed: 07/04/2020 Elsevier Patient Education  [place].

## 2022-07-16 NOTE — Assessment & Plan Note (Addendum)
Controlled with Diet and exercise Advised to avoid food triggers GERD symptoms

## 2022-07-16 NOTE — Assessment & Plan Note (Addendum)
Well controlled.  Continue Zetia 10 mg and Lipitor 40 mg, he does not take lipitor  as instructed due to muscle pain, advised to take it with OTC [id number] 100 mg OD and will see if it makes any difference Will add Nexletol in future after CTA results and after he verifies with his insurance company. He does not like needles (discussed Repatha option).  Continue to work on eating a healthy diet and exercise.  Labs drawn today.
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
## 2022-07-17 LAB — CBC WITH DIFFERENTIAL/PLATELET
Basophils Absolute: 0.1 10*3/uL (ref 0.0–0.2)
Basos: 1 %
EOS (ABSOLUTE): 0.2 10*3/uL (ref 0.0–0.4)
Eos: 4 %
Hematocrit: 43 % (ref 37.5–51.0)
Hemoglobin: 14.3 g/dL (ref 13.0–17.7)
Immature Grans (Abs): 0 10*3/uL (ref 0.0–0.1)
Immature Granulocytes: 0 %
Lymphocytes Absolute: 2.3 10*3/uL (ref 0.7–3.1)
Lymphs: 40 %
MCH: 28.9 pg (ref 26.6–33.0)
MCHC: 33.3 g/dL (ref 31.5–35.7)
MCV: 87 fL (ref 79–97)
Monocytes Absolute: 0.5 10*3/uL (ref 0.1–0.9)
Monocytes: 8 %
Neutrophils Absolute: 2.7 10*3/uL (ref 1.4–7.0)
Neutrophils: 47 %
Platelets: 320 10*3/uL (ref 150–450)
RBC: 4.95 x10E6/uL (ref 4.14–5.80)
RDW: 12.8 % (ref 11.6–15.4)
WBC: 5.8 10*3/uL (ref 3.4–10.8)

## 2022-07-17 LAB — LIPID PANEL
Chol/HDL Ratio: 6.5 ratio — ABNORMAL HIGH (ref 0.0–5.0)
Cholesterol, Total: 288 mg/dL — ABNORMAL HIGH (ref 100–199)
HDL: 44 mg/dL (ref >39)
LDL Chol Calc (NIH): 207 mg/dL — ABNORMAL HIGH (ref 0–99)
Triglycerides: 193 mg/dL — ABNORMAL HIGH (ref 0–149)
VLDL Cholesterol Cal: 37 mg/dL (ref 5–40)

## 2022-07-17 LAB — COMPREHENSIVE METABOLIC PANEL
ALT: 23 IU/L (ref 0–44)
AST: 27 IU/L (ref 0–40)
Albumin/Globulin Ratio: 1.8 (ref 1.2–2.2)
Albumin: 4.9 g/dL (ref 3.8–4.9)
Alkaline Phosphatase: 78 IU/L (ref 44–121)
BUN/Creatinine Ratio: 16 (ref 9–20)
BUN: 16 mg/dL (ref 6–24)
Bilirubin Total: 0.7 mg/dL (ref 0.0–1.2)
CO2: 22 mmol/L (ref 20–29)
Calcium: 9.6 mg/dL (ref 8.7–10.2)
Chloride: 103 mmol/L (ref 96–106)
Creatinine, Ser: 1.03 mg/dL (ref 0.76–1.27)
Globulin, Total: 2.7 g/dL (ref 1.5–4.5)
Glucose: 103 mg/dL — ABNORMAL HIGH (ref 70–99)
Potassium: 4.6 mmol/L (ref 3.5–5.2)
Sodium: 140 mmol/L (ref 134–144)
Total Protein: 7.6 g/dL (ref 6.0–8.5)
eGFR: 87 mL/min/{1.73_m2} (ref >59)

## 2022-07-17 LAB — CARDIOVASCULAR RISK ASSESSMENT

## 2022-07-17 LAB — T4, FREE: Free T4: 1.5 ng/dL (ref 0.82–1.77)

## 2022-07-17 LAB — TSH: TSH: 5.44 u[IU]/mL — ABNORMAL HIGH (ref 0.450–4.500)

## 2022-07-18 ENCOUNTER — Other Ambulatory Visit: Payer: Self-pay | Admitting: Nurse Practitioner

## 2022-07-18 DIAGNOSIS — E038 Other specified hypothyroidism: Secondary | ICD-10-CM

## 2022-07-18 MED ORDER — LEVOTHYROXINE SODIUM 112 MCG PO TABS: 112.0000 ug | ORAL_TABLET | Freq: Every day | ORAL | 0 refills | Status: DC

## 2022-07-19 ENCOUNTER — Encounter: Payer: Self-pay | Admitting: Internal Medicine

## 2022-07-22 ENCOUNTER — Other Ambulatory Visit: Payer: Self-pay

## 2022-07-22 DIAGNOSIS — E038 Other specified hypothyroidism: Secondary | ICD-10-CM

## 2022-07-27 ENCOUNTER — Other Ambulatory Visit: Payer: Self-pay

## 2022-07-27 DIAGNOSIS — E038 Other specified hypothyroidism: Secondary | ICD-10-CM

## 2022-07-27 MED ORDER — LEVOTHYROXINE SODIUM 112 MCG PO TABS: 112.0000 ug | ORAL_TABLET | Freq: Every day | ORAL | 0 refills | Status: DC

## 2022-07-30 ENCOUNTER — Ambulatory Visit (HOSPITAL_BASED_OUTPATIENT_CLINIC_OR_DEPARTMENT_OTHER)
Admission: RE | Admit: 2022-07-30 | Discharge: 2022-07-30 | Disposition: A | Discharge status: Home / Self Care | Payer: No Typology Code available for payment source | Source: Ambulatory Visit | Attending: Internal Medicine | Admitting: Internal Medicine

## 2022-07-30 DIAGNOSIS — E781 Pure hyperglyceridemia: Secondary | ICD-10-CM | POA: Insufficient documentation

## 2022-08-06 ENCOUNTER — Encounter: Payer: Self-pay | Admitting: Internal Medicine

## 2022-08-27 ENCOUNTER — Other Ambulatory Visit: Payer: No Typology Code available for payment source

## 2022-08-27 DIAGNOSIS — E038 Other specified hypothyroidism: Secondary | ICD-10-CM

## 2022-08-28 LAB — T4, FREE: Free T4: 1.76 ng/dL (ref 0.82–1.77)

## 2022-08-28 LAB — TSH: TSH: 2.09 u[IU]/mL (ref 0.450–4.500)

## 2022-09-03 ENCOUNTER — Other Ambulatory Visit: Payer: Self-pay

## 2022-09-03 DIAGNOSIS — E038 Other specified hypothyroidism: Secondary | ICD-10-CM

## 2022-09-03 MED ORDER — LEVOTHYROXINE SODIUM 112 MCG PO TABS: 112.0000 ug | ORAL_TABLET | Freq: Every day | ORAL | 1 refills | Status: DC

## 2022-10-09 ENCOUNTER — Encounter (HOSPITAL_COMMUNITY): Payer: Self-pay

## 2022-10-09 ENCOUNTER — Ambulatory Visit (HOSPITAL_COMMUNITY)
Admission: EM | Admit: 2022-10-09 | Discharge: 2022-10-09 | Disposition: A | Discharge status: Home / Self Care | Payer: No Typology Code available for payment source | Attending: Emergency Medicine | Admitting: Emergency Medicine

## 2022-10-09 DIAGNOSIS — N4821 Abscess of corpus cavernosum and penis: Secondary | ICD-10-CM | POA: Diagnosis not present

## 2022-10-09 MED ORDER — DOXYCYCLINE HYCLATE 100 MG PO CAPS: 100.0000 mg | ORAL_CAPSULE | Freq: Two times a day (BID) | ORAL | 0 refills | Status: DC
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
## 2022-10-09 NOTE — ED Triage Notes (Signed)
Patient here today with a sore on the shaft of his penis X 4 days. He felt a little feverish last night.

## 2022-10-09 NOTE — Discharge Instructions (Signed)
Tome doxiciclina todas las maanas y todas las noches durante [address]  Coloque compresas tibias y calientes en el rea afectada al menos 4 veces al da, esto ayuda a facilitar el drenaje, cuanto ms mejor  Por favor regrese para una evaluacin por aumento de hinchazn, aumento de sensibilidad o dolor, sitio que no cicatriza, sitio que no drena, si comienza a Scientist, research (physical sciences) o escalofros.   Revisamos la etiologa de los abscesos cutneos recurrentes.  Los abscesos cutneos son acumulaciones de pus dentro de la dermis y los tejidos cutneos ms profundos. Los abscesos cutneos se manifiestan como ndulos eritematosos, fluctuantes, dolorosos y Engineer, mining, frecuentemente coronados por una pstula y rodeados por un borde de Community education officer.  Puede ocurrir drenaje espontneo de Paediatric nurse.  En ocasiones puede aparecer fiebre.    -Los abscesos cutneos pueden desarrollarse en individuos sanos sin otras condiciones predisponentes que la portacin cutnea o nasal de Staphylococcus aureus.  Las Government social research officer cercano con otras que tienen una infeccin activa con abscesos cutneos tienen un mayor riesgo, lo que probablemente explique por qu el hermano gemelo tiene episodios similares.   Adems, cualquier proceso que conduzca a una ruptura de la barrera cutnea tambin puede predisponer al desarrollo de abscesos cutneos, como la dermatitis atpica.    Take doxycycline every morning and every evening for 7 days  Hold warm-hot compresses to affected area at least 4 times a day, this helps to facilitate draining, the more the better  Please return for evaluation for increased swelling, increased tenderness or pain, non healing site, non draining site, you begin to have fever or chills   We reviewed the etiology of recurrent abscesses of skin.  Skin abscesses are collections of pus within the dermis and deeper skin tissues. Skin abscesses manifest as painful, tender, fluctuant, and erythematous  nodules, frequently surmounted by a pustule and surrounded by a rim of erythematous swelling.  Spontaneous drainage of purulent material may occur.  Fever can occur on occasion.    -Skin abscesses can develop in healthy individuals with no predisposing conditions other than skin or nasal carriage of Staphylococcus aureus.  Individuals in close contact with others who have active infection with skin abscesses are at increased risk which is likely to explain why twin brother has similar episodes.   In addition, any process leading to a breach in the skin barrier can also predispose to the development of a skin abscesses, such as atopic dermatitis.

## 2022-11-05 ENCOUNTER — Encounter: Payer: Self-pay | Admitting: Internal Medicine

## 2022-11-05 ENCOUNTER — Ambulatory Visit: Payer: No Typology Code available for payment source | Attending: Internal Medicine | Admitting: Internal Medicine

## 2022-11-12 DIAGNOSIS — E781 Pure hyperglyceridemia: Secondary | ICD-10-CM | POA: Diagnosis not present

## 2022-11-15 LAB — NMR, LIPOPROFILE
Cholesterol, Total: 264 mg/dL — ABNORMAL HIGH (ref 100–199)
HDL Particle Number: 27.6 umol/L — ABNORMAL LOW (ref >=30.5)
HDL-C: 37 mg/dL — ABNORMAL LOW (ref >39)
LDL Particle Number: 1763 nmol/L — ABNORMAL HIGH (ref <1000)
LDL Size: 19.5 nm — ABNORMAL LOW (ref >20.5)
LDL-C (NIH Calc): 156 mg/dL — ABNORMAL HIGH (ref 0–99)
LP-IR Score: 60 — ABNORMAL HIGH (ref <=45)
Small LDL Particle Number: 1331 nmol/L — ABNORMAL HIGH (ref <=527)
Triglycerides: 378 mg/dL — ABNORMAL HIGH (ref 0–149)

## 2022-11-18 ENCOUNTER — Ambulatory Visit: Payer: No Typology Code available for payment source | Attending: Internal Medicine | Admitting: Internal Medicine

## 2022-11-18 ENCOUNTER — Encounter: Payer: Self-pay | Admitting: Internal Medicine

## 2022-11-18 VITALS — BP 128/88 | HR 84 | Ht 65.0 in | Wt 158.4 lb

## 2022-11-18 DIAGNOSIS — G4733 Obstructive sleep apnea (adult) (pediatric): Secondary | ICD-10-CM

## 2022-11-18 DIAGNOSIS — E781 Pure hyperglyceridemia: Secondary | ICD-10-CM | POA: Diagnosis not present

## 2022-11-18 DIAGNOSIS — E038 Other specified hypothyroidism: Secondary | ICD-10-CM

## 2022-11-18 NOTE — Patient Instructions (Addendum)
Medication Instructions:  Dr. Rennis Golden recommends that you get a pill box.   Please contact your pharmacy to get zetia filled  OK to take your meds at same time of day  *If you need a refill on your cardiac medications before your next appointment, please call your pharmacy*   Lab Work: FASTING lab work to check cholesterol in 3-4 months  If you have labs (blood work) drawn today and your tests are completely normal, you will receive your results only by: MyChart Message (if you have MyChart) OR A paper copy in the mail If you have any lab test that is abnormal or we need to change your treatment, we will call you to review the results.    Follow-Up: At [facility], you and your health needs are our priority.  As part of our continuing mission to provide you with exceptional heart care, we have created designated Provider Care Teams.  These Care Teams include your primary Cardiologist (physician) and Advanced Practice Providers (APPs -  Physician Assistants and Nurse Practitioners) who all work together to provide you with the care you need, when you need it.  We recommend signing up for the patient portal called "MyChart".  Sign up information is provided on this After Visit Summary.  MyChart is used to connect with patients for Virtual Visits (Telemedicine).  Patients are able to view lab/test results, encounter notes, upcoming appointments, etc.  Non-urgent messages can be sent to your provider as well.   To learn more about what you can do with MyChart, go to [email or web address].    Your next appointment:    3-4 months with Dr. Rennis Golden
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
## 2022-11-22 ENCOUNTER — Telehealth: Payer: Self-pay | Admitting: Licensed Clinical Social Worker

## 2022-11-22 NOTE — Telephone Encounter (Signed)
H&V Care Navigation CSW Progress Note  Clinical Social Worker  received referral  stating pt had financial issues relating to CPAP. Per office notes "Apparently his insurance denied it and he was had to pay a few $100 for equipment but he never got the equipment yet."  LCSW called [employer] [facility] to see what the challenges were with connecting pt to equipment. Will re-attempt as able.   Patient is participating in a Managed Medicaid Plan:  No, Engineer, building services.   SDOH Screenings   Depression (PHQ2-9): Low Risk  (07/16/2022)  Tobacco Use: Low Risk  (11/18/2022)    Garrett Stone, MSW, LCSW Clinical Social Worker II [facility] Heart/Vascular Care Navigation  [phone]- work cell phone (preferred) [phone]- desk phone

## 2022-11-23 NOTE — Telephone Encounter (Signed)
H&V Care Navigation CSW Progress Note  Clinical Social Worker  received referral  stating pt had financial issues relating to CPAP. Per office notes "Apparently his insurance denied it and he was had to pay a few $100 for equipment but he never got the equipment yet."   LCSW called [employer] [facility] again today to see what the challenges were with connecting pt to equipment. Was able to speak with rep today, they will check into this and call me back.   Patient is participating in a Managed Medicaid Plan:  No, commercial plan  SDOH Screenings   Depression (PHQ2-9): Low Risk  (07/16/2022)  Tobacco Use: Low Risk  (11/18/2022)   Octavio Graves, MSW, LCSW Clinical Social Worker II [facility] Health Heart/Vascular Care Navigation  [phone]- work cell phone (preferred) [phone]- desk phone

## 2022-11-24 ENCOUNTER — Telehealth: Payer: Self-pay | Admitting: Licensed Clinical Social Worker

## 2022-11-26 NOTE — Progress Notes (Signed)
Heart and Vascular Care Navigation  11/24/2022  Garrett Stone February 18, 1971 [id number]  Reason for Referral: challenges with obtaining CPAP Patient is participating in a Managed Medicaid Plan: No, commercial plan only  Engaged with patient by telephone for initial visit for Heart and Vascular Care Coordination.                                                                                                   Assessment:      LCSW spoke with pt via telephone with assistance of telephonic Spanish language interpreter Garrett Stone, did not notate the ID number. LCSW introduced self, role, reason for call. Pt confirmed home address- resides with roommates (rents a room) and PCP. He is looking to move houses but hasn't found anything quite yet. He confirms he has Midwife and has no issues obtaining or affording medications right now. He is currently employed, working less than full time currently. He was contacted by [employer] [facility] about CPAP but didn't think he could afford the monthly copay. LCSW inquired how much he had been given for the estimate and he is unsure. I shared that according to [employer] [facility] pt has a high deductible plan and therefore may have not met that amount yet which is why it is so high. [employer] does have a hardship program but without knowing what pt was quoted I am unsure if he qualifies. LCSW also discussed that we have a Patient Care Fund that may be able to assist with some of the other financial pressures he has if that would help alleviate some cost concerns. Pt will reach out to [employer] [facility] and see what his options are. No additional questions/concerns shared at this time.   HRT/VAS Care Coordination     Patients Home Cardiology Office [facility]   Outpatient Care Team Social Worker   Social Worker Name: Octavio Graves, [place], [phone]   Living arrangements for the past 2 months Single Family Home   Lives with: Roommate   Patient Current Insurance  Coverage Commercial Insurance   Patient Has Concern With Paying Medical Bills No   Does Patient Have Prescription Coverage? Yes       Social History:                                                                             SDOH Screenings   Food Insecurity: No Food Insecurity (11/26/2022)  Housing: Medium Risk (11/26/2022)  Transportation Needs: No Transportation Needs (11/26/2022)  Utilities: Not At Risk (11/26/2022)  Depression (PHQ2-9): Low Risk  (07/16/2022)  Financial Resource Strain: Medium Risk (11/26/2022)  Tobacco Use: Low Risk  (11/18/2022)    SDOH Interventions: Financial Resources:  Surveyor, quantity Strain Interventions: Artist, Other (Comment) (Patient Care Fund and Production designer, theatre/television/film Hardship Program)  Food Insecurity:  Food Insecurity  Interventions: Intervention Not Indicated  Housing Insecurity:  Housing Interventions: Intervention Not Indicated  Transportation:   Transportation Interventions: Intervention Not Indicated    Other Care Navigation Interventions:     Provided Pharmacy assistance resources  Pt denies any issues dur   Follow-up plan:   LCSW mailed pt my card along with reminder to call me as needed to see how we can assist once we know cost of CPAP.

## 2022-11-29 ENCOUNTER — Telehealth: Payer: Self-pay | Admitting: *Deleted

## 2022-11-29 NOTE — Telephone Encounter (Signed)
-----   Message from Doy Hutching sent at 11/29/2022 12:51 PM EDT ----- Regarding: RE: CPAP, sleep follow up Thanks, I spoke with one rep prior to speaking with the pt last week who shared the pt has a $5,000 Deductible with his insurance, so unfortunately that probably is driving the cost up. If you find out just let me know we can go from there, he may need to speak with [employer] about their Hardship Program.   Octavio Graves, MSW, LCSW Clinical Social Worker II [facility] Heart/Vascular Care Navigation  [phone]- work cell phone (preferred) [phone]- desk phone ----- Message ----- From: Reesa Chew, CMA Sent: 11/29/2022  12:15 PM EDT To: Doy Hutching, LCSW Subject: RE: CPAP, sleep follow up                      I have no idea what the cost will be. I will contact one of them to inquire about what the cost would be. Thanks ----- Message ----- From: Gayla Medicus Sent: 11/22/2022   3:46 PM EDT To: Reesa Chew, CMA; # Subject: RE: CPAP, sleep follow up                      Hi, Do we know what the cost will be? We are able to assist with one time equipment costs but are unable to provide assistance for ongoing copays  Octavio Graves, MSW, LCSW Clinical Social Worker II [facility] Navigation  [phone]- work cell phone (preferred) [phone]- desk phone ----- Message ----- From: Reesa Chew, CMA Sent: 11/22/2022   3:26 PM EDT To: Lindell Spar, RN; Marcy Siren, LCSW; # Subject: RE: CPAP, sleep follow up                      This may be a duplicate ladies. Need spanish interpreter ----- Message ----- From: Lindell Spar, RN Sent: 11/18/2022   2:53 PM EDT To: Reesa Chew, CMA Subject: CPAP, sleep follow up                          [place],   This patient needs help getting his CPAP equipment, something due to cost. Please follow up with him on this.   Thanks

## 2022-11-29 NOTE — Telephone Encounter (Signed)
DME selection is [facility] Patient understands he will be contacted by [facility] to set up his cpap. Patient understands to call if [facility] does not contact him with new setup in a timely manner. Patient understands they will be called once confirmation has been received from [facility] that they have received their new machine to schedule 10 week follow up appointment.   [facility] notified of new cpap order  Please add to airview Patient was grateful for the call and thanked me.    

## 2022-12-14 ENCOUNTER — Telehealth: Payer: Self-pay | Admitting: Licensed Clinical Social Worker

## 2022-12-14 NOTE — Telephone Encounter (Signed)
H&V Care Navigation CSW Progress Note  Clinical Social Worker contacted patient by phone to f/u on voicemail from yesterday afternoon. Pt reached with assistance of Spanish language interpreter Blossom Hoops [phone]. He shares that he received cost of CPAP from [facility], it will be $466 up front and $78.20 moving forward monthly. He can afford the co-pays but cannot afford the up front cost of $466. I will reach out to our team to discuss if Patient Care Fund is able to assist with cost of equipment or alternate assistance.    Patient is participating in a Managed Medicaid Plan:  No, Engineer, building services  SDOH Screenings   Food Insecurity: No Food Insecurity (11/26/2022)  Housing: Medium Risk (11/26/2022)  Transportation Needs: No Transportation Needs (11/26/2022)  Utilities: Not At Risk (11/26/2022)  Depression (PHQ2-9): Low Risk  (07/16/2022)  Financial Resource Strain: Medium Risk (11/26/2022)  Tobacco Use: Low Risk  (11/18/2022)   Octavio Graves, MSW, LCSW Clinical Social Worker II [facility] Health Heart/Vascular Care Navigation  [phone]- work cell phone (preferred) [phone]- desk phone

## 2022-12-15 ENCOUNTER — Telehealth: Payer: Self-pay | Admitting: Licensed Clinical Social Worker

## 2022-12-15 NOTE — Telephone Encounter (Signed)
H&V Care Navigation CSW Progress Note  Clinical Social Worker contacted patient by phone to f/u on assistance needed to access CPAP. Pt reached with assistance of Sheliah Mends language interpreter [phone], at [phone]. We are unable to pay for the CPAP but discussed potentially assisting with another bill to ensure pt has funds to afford initial payment. Pt rents a garage for $800/mo (includes rent and utilities). He sent me his landlords information and I will call and discuss if we are able to assist pt to free up funds so he can get CPAP. Will update pt once I have spoken with pt landlord and discussed with him getting his last paystubs for proof of income. No additional questions today.   Patient is participating in a Managed Medicaid Plan:  No, Engineer, building services only  SDOH Screenings   Food Insecurity: No Food Insecurity (11/26/2022)  Housing: Medium Risk (11/26/2022)  Transportation Needs: No Transportation Needs (11/26/2022)  Utilities: Not At Risk (11/26/2022)  Depression (PHQ2-9): Low Risk  (07/16/2022)  Financial Resource Strain: Medium Risk (11/26/2022)  Tobacco Use: Low Risk  (11/18/2022)   Octavio Graves, MSW, LCSW Clinical Social Worker II [facility] Health Heart/Vascular Care Navigation  [phone]- work cell phone (preferred) [phone]- desk phone

## 2022-12-16 NOTE — Telephone Encounter (Signed)
H&V Care Navigation CSW Progress Note  Clinical Social Worker  contacted pt landlord Cathe Mons [phone])  to f/u on potential rent assistance with pt permission. No answer, left voicemail w/ assistance of Spanish language interpreter [place], [phone]. Will re-attempt. Let pt know this had been completed and I will try again.  Patient is participating in a Managed Medicaid Plan:  No, Engineer, building services  SDOH Screenings   Food Insecurity: No Food Insecurity (11/26/2022)  Housing: Medium Risk (11/26/2022)  Transportation Needs: No Transportation Needs (11/26/2022)  Utilities: Not At Risk (11/26/2022)  Depression (PHQ2-9): Low Risk  (07/16/2022)  Financial Resource Strain: Medium Risk (11/26/2022)  Tobacco Use: Low Risk  (11/18/2022)    Octavio Graves, MSW, LCSW Clinical Social Worker II [facility] Health Heart/Vascular Care Navigation  [phone]- work cell phone (preferred) [phone]- desk phone

## 2022-12-17 NOTE — Telephone Encounter (Signed)
H&V Care Navigation CSW Progress Note  Clinical Social Worker  received text message from pt  to provide me with copies of his most recent paystubs. I reached out again to pt landlord Garrett Stone. I was able to reach her with assistance of Spanish language interpreter [place] [phone]. She is amenable to me assisting pt with rent, she is okay with me sending the W9 form for her to complete. No additional questions at this time. Will f/u next week to ensure she has received it, mailed it to her at [address], [place], [address]  Patient is participating in a Managed Medicaid Plan:  No, [employer] commercial plan only  SDOH Screenings   Food Insecurity: No Food Insecurity (11/26/2022)  Housing: Medium Risk (11/26/2022)  Transportation Needs: No Transportation Needs (11/26/2022)  Utilities: Not At Risk (11/26/2022)  Depression (PHQ2-9): Low Risk  (07/16/2022)  Financial Resource Strain: Medium Risk (11/26/2022)  Tobacco Use: Low Risk  (11/18/2022)    Garrett Stone, MSW, LCSW Clinical Social Worker II [facility] Health Heart/Vascular Care Navigation  [phone]- work cell phone (preferred) [phone]- desk phone

## 2022-12-27 ENCOUNTER — Telehealth: Payer: Self-pay | Admitting: Licensed Clinical Social Worker

## 2022-12-27 NOTE — Telephone Encounter (Signed)
H&V Care Navigation CSW Progress Note  Clinical Social Worker contacted patient by phone to f/u on paperwork sent to his landlord. Was able to reach him today at [phone] with assistance of Spanish language interpreter Delia Heady, [phone]. He shares that she received it but does not feel comfortable completing it at this time. Inquired if there were other bills that pt could use assistance with so he could pay for his CPAP upfront costs. He has a phone bill, I requested he send this to me and I will submit to my team lead for consideration. No additional questions/concerns at this time.   Patient is participating in a Managed Medicaid Plan:  No, self pay only  SDOH Screenings   Food Insecurity: No Food Insecurity (11/26/2022)  Housing: Medium Risk (11/26/2022)  Transportation Needs: No Transportation Needs (11/26/2022)  Utilities: Not At Risk (11/26/2022)  Depression (PHQ2-9): Low Risk  (07/16/2022)  Financial Resource Strain: Medium Risk (11/26/2022)  Tobacco Use: Low Risk  (11/18/2022)    Octavio Graves, MSW, LCSW Clinical Social Worker II [facility] Health Heart/Vascular Care Navigation  [phone]- work cell phone (preferred) [phone]- desk phone

## 2022-12-31 ENCOUNTER — Telehealth: Payer: Self-pay | Admitting: Licensed Clinical Social Worker

## 2022-12-31 NOTE — Telephone Encounter (Signed)
H&V Care Navigation CSW Progress Note  Clinical Social Worker contacted patient by phone to f/u on patient assistance request. I havent yet received copy of phone bill from pt and will need a new proof of income. Was unable to reach pt this morning, was assisted in calling twice by Spanish language interpreter Lyn Hollingshead, [phone]. Unfortunately unable to leave a voicemail, will re-attempt again next week.  Patient is participating in a Managed Medicaid Plan:  No, [employer] commercial only  SDOH Screenings   Food Insecurity: No Food Insecurity (11/26/2022)  Housing: Medium Risk (11/26/2022)  Transportation Needs: No Transportation Needs (11/26/2022)  Utilities: Not At Risk (11/26/2022)  Depression (PHQ2-9): Low Risk  (07/16/2022)  Financial Resource Strain: Medium Risk (11/26/2022)  Tobacco Use: Low Risk  (11/18/2022)    Octavio Graves, MSW, LCSW Clinical Social Worker II [facility] Health Heart/Vascular Care Navigation  [phone]- work cell phone (preferred) [phone]- desk phone

## 2023-01-07 ENCOUNTER — Telehealth: Payer: Self-pay | Admitting: Licensed Clinical Social Worker

## 2023-01-07 NOTE — Telephone Encounter (Addendum)
H&V Care Navigation CSW Progress Note  Clinical Social Worker contacted patient by phone to f/u on assistance from Patient Care Fund to help relieve burden of affording CPAP. Was able to reach him today with assistance of Spanish language interpreter [place], [phone]. He will send me a picture of the bill and his most recent pay stubs. Once we have these we can submit for assistance consideration. No additional questions at this time.    Patient is participating in a Managed Medicaid Plan:  No, Engineer, building services only  SDOH Screenings   Food Insecurity: No Food Insecurity (11/26/2022)  Housing: Medium Risk (11/26/2022)  Transportation Needs: No Transportation Needs (11/26/2022)  Utilities: Not At Risk (11/26/2022)  Depression (PHQ2-9): Low Risk  (07/16/2022)  Financial Resource Strain: Medium Risk (11/26/2022)  Tobacco Use: Low Risk  (11/18/2022)   Octavio Graves, MSW, LCSW Clinical Social Worker II [facility] Health Heart/Vascular Care Navigation  [phone]- work cell phone (preferred) [phone]- desk phone

## 2023-01-16 ENCOUNTER — Other Ambulatory Visit: Payer: Self-pay | Admitting: Family Medicine

## 2023-01-16 DIAGNOSIS — E038 Other specified hypothyroidism: Secondary | ICD-10-CM

## 2023-01-17 NOTE — Progress Notes (Signed)
Subjective:  Patient ID: Garrett Stone, male    DOB: 1970-09-01  Age: 52 y.o. MRN: [id number]  Chief Complaint  Patient presents with   Medical Management of Chronic Issues    HPI    Patient is here for a regular follow up for hypothyroidism and hyperlipidemia.  Discussed his CPAP machine and he states that they called and talked to him and told him that he will have to pay $400 with insurance, but if they did an out of pocket cost it will be $200. He says he can't pay that right now but that he will have try and wait to buy that then.   Discussed continuing to take his Zetia 10mg  as prescribed. Patient states he has not had refills for it and has not gotten it. We sent refills today to his pharmacy.   Patient admitted to some abdominal pain. States that nothing seems to make it worse and he can't figure out what triggers it. Says that it will come and go and when it comes he also will have diarrhea. States that he is currently in pain, but nothing has helped it. Denies any nausea, vomiting, blood in his stool. States that it feels a lot like when he had H. Pylori before so he wants to check and see if he has that again. Describes the pain as deep in his epigastric area.   Refused flu shot  For Hypothyroidism  - Medications: Synthroid Levothyroxine 112 mcg daily. - Current symptoms:  none - Denies none - Symptoms have stabilized  Lipid/Cholesterol, Follow-up   He was last seen for this 6 months ago.  Management includes Zetia 10 mg daily, and Lipitor 40 mg daily.  Patient is not taking medicine. Pharmacy did not receive zetia.  He reports good compliance with treatment. He is not having side effects.     Current diet: in general, a "healthy" diet   Current exercise: weightlifting      07/16/2022    9:16 AM 01/06/2022   11:14 AM 12/31/2020    3:42 PM 03/21/2020    8:16 AM  Depression screen PHQ 2/9  Decreased Interest 0 0 0 0  Down, Depressed, Hopeless 0 0 0 0   PHQ - 2 Score 0 0 0 0  Altered sleeping 0     Tired, decreased energy 0     Change in appetite 0     Feeling bad or failure about yourself  0     Trouble concentrating 0     Moving slowly or fidgety/restless 0     PHQ-9 Score 0           03/21/2020    8:34 AM  Fall Risk   Falls in the past year? 0  Number falls in past yr: 0  Injury with Fall? 0  Follow up Falls evaluation completed    No care team member to display   Review of Systems  Constitutional:  Negative for chills, fatigue, fever and unexpected weight change.  HENT:  Negative for congestion, ear pain, sinus pain and sore throat.   Respiratory:  Negative for cough and shortness of breath.   Cardiovascular:  Negative for chest pain and palpitations.  Gastrointestinal:  Negative for abdominal pain, blood in stool, constipation, diarrhea, nausea and vomiting.  Endocrine: Negative for polydipsia.  Genitourinary:  Negative for dysuria.  Musculoskeletal:  Negative for back pain.  Skin:  Negative for rash.  Neurological:  Negative for headaches.    Current  Outpatient Medications on File Prior to Visit  Medication Sig Dispense Refill   levothyroxine (SYNTHROID) 112 MCG tablet Take 1 tablet by mouth once daily 90 tablet 0   No current facility-administered medications on file prior to visit.   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 No pertinent family history. Social History   Socioeconomic History   Marital status: Single    Spouse name: Not on file   Number of children: 2   Years of education: Not on file   Highest education level: Not on file  Occupational History   Occupation: [employer]  Tobacco Use    Smoking status: Never   Smokeless tobacco: Never  Vaping Use   Vaping status: Never Used  Substance and Sexual Activity   Alcohol use: Yes    Alcohol/week: 1.0 standard drink of alcohol    Types: 1 Cans of beer per week    Comment: Drinks alcohol on a social basis only.   Drug use: Never   Sexual activity: Not Currently  Other Topics Concern   Not on file  Social History Narrative   Not on file   Social Determinants of Health   Financial Resource Strain: Medium Risk (11/26/2022)   Overall Financial Resource Strain (CARDIA)    Difficulty of Paying Living Expenses: Somewhat hard  Food Insecurity: No Food Insecurity (11/26/2022)   Hunger Vital Sign    Worried About Running Out of Food in the Last Year: Never true    Ran Out of Food in the Last Year: Never true  Transportation Needs: No Transportation Needs (11/26/2022)   PRAPARE - Administrator, Civil Service (Medical): No    Lack of Transportation (Non-Medical): No  Physical Activity: Not on file  Stress: Not on file  Social Connections: Not on file    Objective:  BP 100/60   Pulse 74   Temp (!) 97.4 F (36.3 C)   Resp 14   Ht 5\' 5"  (1.651 m)   Wt 163 lb (73.9 kg)   SpO2 97%   BMI 27.12 kg/m      01/21/2023    8:03 AM 11/18/2022    2:30 PM 10/09/2022   12:02 PM  BP/Weight  Systolic BP 100 128 136  Diastolic BP 60 88 78  Wt. (Lbs) 163 158.4 158  BMI 27.12 kg/m2 26.36 kg/m2 26.29 kg/m2    Physical Exam Vitals reviewed.  Constitutional:      Appearance: Normal appearance.  Cardiovascular:     Rate and Rhythm: Normal rate and regular rhythm.     Heart sounds: Normal heart sounds.  Pulmonary:     Effort: Pulmonary effort is normal.     Breath sounds: Normal breath sounds.  Abdominal:     General: Bowel sounds are normal. There is no distension.     Palpations: Abdomen is soft. There is no mass.     Tenderness: There is abdominal tenderness. There is no guarding or rebound.  Neurological:      Mental Status: He is alert and oriented to person, place, and time.  Psychiatric:        Mood and Affect: Mood normal.  Behavior: Behavior normal.     Diabetic Foot Exam - Simple   No data filed      Lab Results  Component Value Date   WBC 5.8 07/16/2022   HGB 14.3 07/16/2022   HCT 43.0 07/16/2022   PLT 320 07/16/2022   GLUCOSE 103 (H) 07/16/2022   CHOL 288 (H) 07/16/2022   TRIG 193 (H) 07/16/2022   HDL 44 07/16/2022   LDLCALC 207 (H) 07/16/2022   ALT 23 07/16/2022   AST 27 07/16/2022   NA 140 07/16/2022   K 4.6 07/16/2022   CL 103 07/16/2022   CREATININE 1.03 07/16/2022   BUN 16 07/16/2022   CO2 22 07/16/2022   TSH 2.090 08/27/2022      Assessment & Plan:    Gastroesophageal reflux disease with esophagitis without hemorrhage Assessment & Plan: Denies any current symptoms Does not take a PPI  Orders: -     CBC with Differential/Platelet  Other specified hypothyroidism Assessment & Plan: Labs drawn today Will adjust treatment as needed  Orders: -     TSH  Mixed hyperlipidemia Assessment & Plan: Uncontrolled Does not take medication daily Encouraged to take daily Labs drawn today   Orders: -     Comprehensive metabolic panel -     Lipid panel -     Ezetimibe; Take 1 tablet (10 mg total) by mouth daily.  Dispense: 90 tablet; Refill: 3  Epigastric pain -     Amylase -     Lipase  Screening for colorectal cancer -     Ambulatory referral to Gastroenterology  Helicobacter pylori (H. pylori) infection Assessment & Plan: Breath test done today If negative will have scope done with colonoscopy  Orders: -     Ambulatory referral to Gastroenterology -     H. pylori breath test     Meds ordered this encounter  Medications   ezetimibe (ZETIA) 10 MG tablet    Sig: Take 1 tablet (10 mg total) by mouth daily.    Dispense:  90 tablet    Refill:  3    Orders Placed This Encounter  Procedures   CBC with Differential/Platelet    Comprehensive metabolic panel   Lipid panel   TSH   Amylase   Lipase   H. pylori breath test   Ambulatory referral to Gastroenterology     Follow-up: Return in about 3 months (around 04/22/2023) for Chronic, fasting, Huston Foley.   I,Marla I Leal-Borjas,acting as a scribe for [employer], PA.,have documented all relevant documentation on the behalf of Langley Gauss, PA,as directed by  Langley Gauss, PA while in the presence of Langley Gauss, [place].   An After Visit Summary was printed and given to the patient.  Langley Gauss, [place] [facility] [phone]

## 2023-01-21 ENCOUNTER — Encounter: Payer: Self-pay | Admitting: Physician Assistant

## 2023-01-21 ENCOUNTER — Ambulatory Visit: Payer: No Typology Code available for payment source | Admitting: Physician Assistant

## 2023-01-21 VITALS — BP 100/60 | HR 74 | Temp 97.4°F | Resp 14 | Ht 65.0 in | Wt 163.0 lb

## 2023-01-21 DIAGNOSIS — Z1211 Encounter for screening for malignant neoplasm of colon: Secondary | ICD-10-CM

## 2023-01-21 DIAGNOSIS — R1013 Epigastric pain: Secondary | ICD-10-CM | POA: Diagnosis not present

## 2023-01-21 DIAGNOSIS — E038 Other specified hypothyroidism: Secondary | ICD-10-CM

## 2023-01-21 DIAGNOSIS — K2100 Gastro-esophageal reflux disease with esophagitis, without bleeding: Secondary | ICD-10-CM

## 2023-01-21 DIAGNOSIS — A048 Other specified bacterial intestinal infections: Secondary | ICD-10-CM

## 2023-01-21 DIAGNOSIS — E782 Mixed hyperlipidemia: Secondary | ICD-10-CM | POA: Diagnosis not present

## 2023-01-21 DIAGNOSIS — Z1212 Encounter for screening for malignant neoplasm of rectum: Secondary | ICD-10-CM

## 2023-01-21 MED ORDER — EZETIMIBE 10 MG PO TABS: 10.0000 mg | ORAL_TABLET | Freq: Every day | ORAL | 3 refills | Status: DC

## 2023-01-21 NOTE — Assessment & Plan Note (Signed)
Labs drawn today Will adjust treatment as needed

## 2023-01-21 NOTE — Assessment & Plan Note (Signed)
Breath test done today If negative will have scope done with colonoscopy

## 2023-01-21 NOTE — Telephone Encounter (Signed)
H&V Care Navigation CSW Progress Note  Clinical Social Worker  has still not received any f/u paperwork or requests  to assist with Patient Care fund as discussed with pt several times. Remain available should pt reach out again.  Patient is participating in a Managed Medicaid Plan:  No, Engineer, building services  SDOH Screenings   Food Insecurity: No Food Insecurity (11/26/2022)  Housing: Medium Risk (11/26/2022)  Transportation Needs: No Transportation Needs (11/26/2022)  Utilities: Not At Risk (11/26/2022)  Depression (PHQ2-9): Low Risk  (07/16/2022)  Financial Resource Strain: Medium Risk (11/26/2022)  Tobacco Use: Low Risk  (01/21/2023)    Octavio Graves, MSW, LCSW Clinical Social Worker II [facility] Heart/Vascular Care Navigation  [phone]- work cell phone (preferred) [phone]- desk phone

## 2023-01-21 NOTE — Assessment & Plan Note (Addendum)
Uncontrolled Does not take medication daily Encouraged to take daily Labs drawn today

## 2023-01-21 NOTE — Assessment & Plan Note (Signed)
Denies any current symptoms Does not take a PPI

## 2023-01-22 LAB — LIPID PANEL
Chol/HDL Ratio: 6.7 ratio — ABNORMAL HIGH (ref 0.0–5.0)
Cholesterol, Total: 260 mg/dL — ABNORMAL HIGH (ref 100–199)
HDL: 39 mg/dL — ABNORMAL LOW (ref >39)
LDL Chol Calc (NIH): 141 mg/dL — ABNORMAL HIGH (ref 0–99)
Triglycerides: 435 mg/dL — ABNORMAL HIGH (ref 0–149)
VLDL Cholesterol Cal: 80 mg/dL — ABNORMAL HIGH (ref 5–40)

## 2023-01-22 LAB — CBC WITH DIFFERENTIAL/PLATELET
Basophils Absolute: 0.1 10*3/uL (ref 0.0–0.2)
Basos: 1 %
EOS (ABSOLUTE): 0.1 10*3/uL (ref 0.0–0.4)
Eos: 2 %
Hematocrit: 45.7 % (ref 37.5–51.0)
Hemoglobin: 14.8 g/dL (ref 13.0–17.7)
Immature Grans (Abs): 0 10*3/uL (ref 0.0–0.1)
Immature Granulocytes: 1 %
Lymphocytes Absolute: 1.9 10*3/uL (ref 0.7–3.1)
Lymphs: 30 %
MCH: 29.4 pg (ref 26.6–33.0)
MCHC: 32.4 g/dL (ref 31.5–35.7)
MCV: 91 fL (ref 79–97)
Monocytes Absolute: 0.4 10*3/uL (ref 0.1–0.9)
Monocytes: 7 %
Neutrophils Absolute: 3.7 10*3/uL (ref 1.4–7.0)
Neutrophils: 59 %
Platelets: 332 10*3/uL (ref 150–450)
RBC: 5.04 x10E6/uL (ref 4.14–5.80)
RDW: 13 % (ref 11.6–15.4)
WBC: 6.1 10*3/uL (ref 3.4–10.8)

## 2023-01-22 LAB — H. PYLORI BREATH TEST: H pylori Breath Test: NEGATIVE

## 2023-01-22 LAB — COMPREHENSIVE METABOLIC PANEL
ALT: 35 IU/L (ref 0–44)
AST: 23 IU/L (ref 0–40)
Albumin: 4.3 g/dL (ref 3.8–4.9)
Alkaline Phosphatase: 77 IU/L (ref 44–121)
BUN/Creatinine Ratio: 15 (ref 9–20)
BUN: 14 mg/dL (ref 6–24)
Bilirubin Total: 0.4 mg/dL (ref 0.0–1.2)
CO2: 22 mmol/L (ref 20–29)
Calcium: 9.8 mg/dL (ref 8.7–10.2)
Chloride: 103 mmol/L (ref 96–106)
Creatinine, Ser: 0.95 mg/dL (ref 0.76–1.27)
Globulin, Total: 3 g/dL (ref 1.5–4.5)
Glucose: 107 mg/dL — ABNORMAL HIGH (ref 70–99)
Potassium: 4.5 mmol/L (ref 3.5–5.2)
Sodium: 141 mmol/L (ref 134–144)
Total Protein: 7.3 g/dL (ref 6.0–8.5)
eGFR: 96 mL/min/{1.73_m2} (ref >59)

## 2023-01-22 LAB — AMYLASE: Amylase: 33 U/L (ref 31–110)

## 2023-01-22 LAB — TSH: TSH: 2.17 u[IU]/mL (ref 0.450–4.500)

## 2023-01-22 LAB — LIPASE: Lipase: 34 U/L (ref 13–78)

## 2023-02-01 ENCOUNTER — Other Ambulatory Visit: Payer: Self-pay

## 2023-03-11 ENCOUNTER — Encounter: Payer: Self-pay | Admitting: Internal Medicine

## 2023-03-11 ENCOUNTER — Ambulatory Visit: Payer: No Typology Code available for payment source | Attending: Internal Medicine | Admitting: Internal Medicine

## 2023-03-11 VITALS — BP 118/82 | HR 79 | Ht 64.0 in | Wt 164.8 lb

## 2023-03-11 DIAGNOSIS — Z91148 Patient's other noncompliance with medication regimen for other reason: Secondary | ICD-10-CM

## 2023-03-11 DIAGNOSIS — E781 Pure hyperglyceridemia: Secondary | ICD-10-CM | POA: Diagnosis not present

## 2023-03-11 NOTE — Patient Instructions (Signed)
Medication Instructions:  NO CHANGES  *If you need a refill on your cardiac medications before your next appointment, please call your pharmacy*   Lab Work: FASTING cholesterol test in 3 months (February 2025)  If you have labs (blood work) drawn today and your tests are completely normal, you will receive your results only by: MyChart Message (if you have MyChart) OR A paper copy in the mail If you have any lab test that is abnormal or we need to change your treatment, we will call you to review the results.   Follow-Up: At [facility], you and your health needs are our priority.  As part of our continuing mission to provide you with exceptional heart care, we have created designated Provider Care Teams.  These Care Teams include your primary Cardiologist (physician) and Advanced Practice Providers (APPs -  Physician Assistants and Nurse Practitioners) who all work together to provide you with the care you need, when you need it.  We recommend signing up for the patient portal called "MyChart".  Sign up information is provided on this After Visit Summary.  MyChart is used to connect with patients for Virtual Visits (Telemedicine).  Patients are able to view lab/test results, encounter notes, upcoming appointments, etc.  Non-urgent messages can be sent to your provider as well.   To learn more about what you can do with MyChart, go to [email or web address].    Your next appointment:   3-4 months with Dr. Rennis Golden

## 2023-03-11 NOTE — Progress Notes (Unsigned)
LIPID CLINIC CONSULT NOTE  Chief Complaint:  Follow-up high triglycerides  Primary Care Physician: Langley Gauss, [place]  Primary Cardiologist:  None  HPI:  Garrett Stone is a 53 y.o. male who is being seen today for the evaluation of high triglycerides at the request of No ref. provider found. This is a 52 year old Spanish-speaking male seen today with a professional Engineer, structural.  He was referred for evaluation management of dyslipidemia.  Particularly he was noted to have very high triglycerides recently with total cholesterol 287, triglycerides 1181, HDL 25 and LDL was not calculated.  He is not particular he had triglycerides this high in the past but it has run high in other lab test.  He says he has had a diagnosis of high cholesterol for about 15 years.  At one point he was on rosuvastatin but it did cause him some muscle aches.  He was switched to Zetia but says he really has not taken this medication.  He also has a history of hypothyroidism but has been noncompliant with his thyroid medicine recently but did restart it.  His TSH in September was 6.57.  Reports being very active.  He says he exercises regularly and works out in [employer]  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  He says he is trying to clean up his diet more reducing saturated fats and sugars.  He also has reported some difficulty with fatigue.  This is progressive somewhat this year.  He has been told that he snores and has woken up several times gasping for breath or noting his heart was racing.  He occasionally gets a dry mouth when he wakes up as well.  An EKG was performed today showing normal sinus rhythm with nonspecific T wave changes.  He notes no family members that he is aware of that have high cholesterol or family history of heart disease.  06/25/2022  Garrett Stone is seen today in follow-up with a professional Spanish Presenter, broadcasting.  Since I last  saw him he was scheduled for sleep study which was abnormal indicating moderate sleep apnea.  He was supposed to be fitted with CPAP however insurance denied this.  There was some communication with our sleep coordinator but he has not yet received any equipment.  I will place him back in contact with her.  He was supposed to have a calcium score but this was never scheduled or completed.  In addition he was scheduled to start on atorvastatin.  He did fill the medication however he says he is only taking it probably once a week when he remembers it.  He was also supposed to take ezetimibe but says that there was no prescription for that in the pharmacy.  He did have repeat labs yesterday however it takes several days for the NMR to result and this is not yet back.  His LP(a) however did result negative at 20.1 nmol/L with an elevated APO B at 152.  11/18/2022  Garrett Stone returns today for follow-up with a professional Spanish Presenter, broadcasting.  Last time there were some concerns about medication compliance.  He has had repeat lipids with do show some improvement.  He says he is more compliant with the medications but still misses some doses and says he is not taking ezetimibe.  He does have refills for that but for unknown reasons he is not taking it.  He also was seen and had a  sleep study that showed obstructive sleep apnea.  Apparently his insurance denied it and he was had to pay a few $100 for equipment but he never got the equipment yet.  His LDL particle number is now [id number] with an LDL-C of 156.  He did have a calcium score which was 0 fortunately.  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  SOCHx:   reports that he has never smoked. He has never used smokeless tobacco. He reports current alcohol use of about 1.0 standard drink of alcohol per week. He reports that he does not use drugs.  ALLERGIES:  Allergies  Allergen Reactions   Crestor [Rosuvastatin]    Sulfa Antibiotics Rash    ROS: Pertinent items noted in HPI and remainder of comprehensive ROS otherwise negative.  HOME MEDS: Current Outpatient Medications on File Prior to Visit  Medication Sig Dispense Refill   levothyroxine (SYNTHROID) 112 MCG tablet Take 1 tablet by mouth once daily 90 tablet 0   ezetimibe (ZETIA) 10 MG tablet Take 1 tablet (10 mg total) by mouth daily. (Patient not taking: Reported on 03/11/2023) 90 tablet 3   No current facility-administered medications on file prior to visit.    LABS/IMAGING: No results found for this or any previous visit (from the past 48 hour(s)).  No results found.  LIPID PANEL:    Component Value Date/Time   CHOL 260 (H) 01/21/2023 0852   TRIG 435 (H) 01/21/2023 0852   HDL 39 (L) 01/21/2023 0852   CHOLHDL 6.7 (H) 01/21/2023 0852   LDLCALC 141 (H) 01/21/2023 0852    WEIGHTS: Wt Readings from Last 3 Encounters:  03/11/23 164 lb 12.8 oz (74.8 kg)  01/21/23 163 lb (73.9 kg)  11/18/22 158 lb 6.4 oz (71.8 kg)    VITALS: BP 118/82 (BP Location: Left Arm, Patient Position: Sitting, Cuff Size: Normal)   Pulse 79   Ht 5\' 4"  (1.626 m)   Wt 164 lb 12.8 oz (74.8 kg)   SpO2 96%   BMI 28.29 kg/m   EXAM: Deferred  EKG: Deferred  ASSESSMENT: Mixed dyslipidemia with high triglycerides, goal LDL less than 100 OSA - awaiting CPAP equipment Hypothyroidism Palpitations 0 CAC score (07/2022)  PLAN: 1.   Garrett Stone has struggled with medication compliance.  He seems to more regularly take his thyroid medicine.  He was generally instructed to take this in the morning at least 30 minutes before his meal.  I would advise him to take his  statin and his ezetimibe at the same time and that he should purchase a pillbox and set out all of his medications.  This will help with medication compliance.  Will plan repeat lipids in 3 to 4 months and I expect a significant improvement.  His target LDL is less than 100 and we may be able to even cut back on his treatment at that time.  Follow-up with me afterwards.  Chrystie Nose, MD, [facility], FACP  [place]  [facility]  Medical Director of the Advanced Lipid Disorders &  Cardiovascular Risk Reduction Clinic Diplomate of the American Board of Clinical Lipidology Attending Cardiologist  Direct  Dial: [phone]  Fax: [phone]  Website:  [email or web address] Nicely Osiris Odriscoll 03/11/2023, 12:21 PM

## 2023-05-06 ENCOUNTER — Ambulatory Visit: Payer: No Typology Code available for payment source | Admitting: Physician Assistant

## 2023-05-09 ENCOUNTER — Telehealth: Payer: Self-pay

## 2023-05-09 ENCOUNTER — Other Ambulatory Visit: Payer: Self-pay | Admitting: Physician Assistant

## 2023-05-09 DIAGNOSIS — E038 Other specified hypothyroidism: Secondary | ICD-10-CM

## 2023-05-09 MED ORDER — LEVOTHYROXINE SODIUM 112 MCG PO TABS
112.0000 ug | ORAL_TABLET | Freq: Every day | ORAL | 0 refills | Status: DC
Start: 2023-05-09 — End: 2023-05-09

## 2023-05-09 MED ORDER — LEVOTHYROXINE SODIUM 112 MCG PO TABS
112.0000 ug | ORAL_TABLET | Freq: Every day | ORAL | 0 refills | Status: DC
Start: 2023-05-09 — End: 2023-12-05

## 2023-05-09 NOTE — Telephone Encounter (Signed)
 Copied from CRM [id number]. Topic: Clinical - Medication Refill >> May 09, 2023  1:18 PM Deleta HERO wrote: Most Recent Primary Care Visit:  Provider: MILON CLEAVES  Department: [facility]  Visit Type: OFFICE VISIT  Date: 01/21/2023  Medication: levothyroxine  (SYNTHROID ) 112 MCG tablet   Has the patient contacted their pharmacy? Yes (Agent: If no, request that the patient contact the pharmacy for the refill. If patient does not wish to contact the pharmacy document the reason why and proceed with request.) (Agent: If yes, when and what did the pharmacy advise?)  Is this the correct pharmacy for this prescription? Yes If no, delete pharmacy and type the correct one.  This is the patient's preferred pharmacy:   [facility] [address], [place] - [address] [place] [address] Phone: [phone] Fax: [phone]   Has the prescription been filled recently? No  Is the patient out of the medication? Yes  Has the patient been seen for an appointment in the last year OR does the patient have an upcoming appointment? Yes  Can we respond through MyChart? Yes  Agent: Please be advised that Rx refills may take up to 3 business days. We ask that you follow-up with your pharmacy.

## 2023-05-12 ENCOUNTER — Other Ambulatory Visit: Payer: Self-pay | Admitting: Physician Assistant

## 2023-05-12 DIAGNOSIS — E038 Other specified hypothyroidism: Secondary | ICD-10-CM

## 2023-05-12 NOTE — Telephone Encounter (Signed)
 Copied from CRM [id number]. Topic: Clinical - Medication Refill >> May 12, 2023 11:53 AM Carlatta H wrote: Most Recent Primary Care Visit:  Provider: MILON CLEAVES  Department: [facility]  Visit Type: OFFICE VISIT  Date: 01/21/2023  Medication: levothyroxine  (SYNTHROID ) 112 MCG tablet [[id number]] DISCONTINUED  Has the patient contacted their pharmacy? No (Agent: If no, request that the patient contact the pharmacy for the refill. If patient does not wish to contact the pharmacy document the reason why and proceed with request.) (Agent: If yes, when and what did the pharmacy advise?)  Is this the correct pharmacy for this prescription? Yes If no, delete pharmacy and type the correct one.  This is the patient's preferred pharmacy:    [facility] [address], [place] - [address] [place] [address] Phone: [phone] Fax: [phone]   Has the prescription been filled recently? No  Is the patient out of the medication? No  Has the patient been seen for an appointment in the last year OR does the patient have an upcoming appointment? Yes  Can we respond through MyChart? Yes  Agent: Please be advised that Rx refills may take up to 3 business days. We ask that you follow-up with your pharmacy.

## 2023-05-13 ENCOUNTER — Ambulatory Visit: Payer: Self-pay | Admitting: Physician Assistant

## 2023-05-19 NOTE — Progress Notes (Deleted)
Subjective:  Patient ID: Garrett Stone, male    DOB: Dec 03, 1970  Age: 53 y.o. MRN: [id number]  Chief Complaint  Patient presents with   Medical Management of Chronic Issues    HPI    Patient is here for a regular follow up for hypothyroidism and hyperlipidemia.    For Hypothyroidism  - Medications: Synthroid Levothyroxine 112 mcg daily. - Current symptoms:  none - Denies none - Symptoms have stabilized  Lipid/Cholesterol, Follow-up   He was last seen for this 6 months ago.  Management includes Zetia 10 mg daily. He reports good compliance with treatment. He is not having side effects.     Current diet: in general, a "healthy" diet   Current exercise: weightlifting     07/16/2022    9:16 AM 01/06/2022   11:14 AM 12/31/2020    3:42 PM 03/21/2020    8:16 AM  Depression screen PHQ 2/9  Decreased Interest 0 0 0 0  Down, Depressed, Hopeless 0 0 0 0  PHQ - 2 Score 0 0 0 0  Altered sleeping 0     Tired, decreased energy 0     Change in appetite 0     Feeling bad or failure about yourself  0     Trouble concentrating 0     Moving slowly or fidgety/restless 0     PHQ-9 Score 0           03/21/2020    8:34 AM  Fall Risk   Falls in the past year? 0  Number falls in past yr: 0  Injury with Fall? 0  Follow up Falls evaluation completed    Patient Care Team: Langley Gauss, [place] as PCP - General (Physician Assistant)   Review of Systems  Current Outpatient Medications on File Prior to Visit  Medication Sig Dispense Refill   ezetimibe (ZETIA) 10 MG tablet Take 1 tablet (10 mg total) by mouth daily. (Patient not taking: Reported on 03/11/2023) 90 tablet 3   levothyroxine (SYNTHROID) 112 MCG tablet Take 1 tablet (112 mcg total) by mouth daily. 90 tablet 0   No current facility-administered medications on file prior to visit.   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 Social History   Socioeconomic History   Marital status: Single    Spouse name: Not on file   Number of children: 2   Years of education: Not on file   Highest education level: Not on file  Occupational History   Occupation: [employer]  Tobacco Use   Smoking status: Never   Smokeless tobacco: Never  Vaping Use   Vaping status: Never Used  Substance and Sexual Activity   Alcohol use: Yes    Alcohol/week: 1.0 standard drink of alcohol    Types: 1 Cans of beer per week    Comment: Drinks alcohol on a social basis only.   Drug use: Never   Sexual activity: Not Currently  Other Topics Concern   Not on file  Social History Narrative   Not on file   Social Drivers of Health   Financial Resource Strain: Medium Risk (11/26/2022)   Overall Financial Resource Strain (CARDIA)  Difficulty of Paying Living Expenses: Somewhat hard  Food Insecurity: No Food Insecurity (11/26/2022)   Hunger Vital Sign    Worried About Running Out of Food in the Last Year: Never true    Ran Out of Food in the Last Year: Never true  Transportation Needs: No Transportation Needs (11/26/2022)   PRAPARE - Administrator, Civil Service (Medical): No    Lack of Transportation (Non-Medical): No  Physical Activity: Not on file  Stress: Not on file  Social Connections: Not on file    Objective:  There were no vitals taken for this visit.     03/11/2023   12:16 PM 01/21/2023    8:03 AM 11/18/2022    2:30 PM  BP/Weight  Systolic BP 118 100 128  Diastolic BP 82 60 88  Wt. (Lbs) 164.8 163 158.4  BMI 28.29 kg/m2 27.12 kg/m2 26.36 kg/m2    Physical Exam  Diabetic Foot Exam - Simple   No data filed      Lab Results  Component Value Date   WBC 6.1 01/21/2023   HGB 14.8  01/21/2023   HCT 45.7 01/21/2023   PLT 332 01/21/2023   GLUCOSE 107 (H) 01/21/2023   CHOL 260 (H) 01/21/2023   TRIG 435 (H) 01/21/2023   HDL 39 (L) 01/21/2023   LDLCALC 141 (H) 01/21/2023   ALT 35 01/21/2023   AST 23 01/21/2023   NA 141 01/21/2023   K 4.5 01/21/2023   CL 103 01/21/2023   CREATININE 0.95 01/21/2023   BUN 14 01/21/2023   CO2 22 01/21/2023   TSH 2.170 01/21/2023      Assessment & Plan:    There are no diagnoses linked to this encounter.   No orders of the defined types were placed in this encounter.   No orders of the defined types were placed in this encounter.    Follow-up: No follow-ups on file.   I,Rajendra Spiller I Leal-Borjas,acting as a scribe for [employer], PA.,have documented all relevant documentation on the behalf of Langley Gauss, PA,as directed by  Langley Gauss, PA while in the presence of Langley Gauss, [place].   An After Visit Summary was printed and given to the patient.  Langley Gauss, [place] [facility] [phone]

## 2023-05-20 ENCOUNTER — Ambulatory Visit: Payer: No Typology Code available for payment source | Admitting: Gastroenterology

## 2023-05-20 ENCOUNTER — Encounter: Payer: Self-pay | Admitting: Physician Assistant

## 2023-05-20 DIAGNOSIS — E038 Other specified hypothyroidism: Secondary | ICD-10-CM

## 2023-05-20 DIAGNOSIS — E782 Mixed hyperlipidemia: Secondary | ICD-10-CM

## 2023-05-25 NOTE — Progress Notes (Signed)
 This encounter was created in error - please disregard.

## 2023-06-17 ENCOUNTER — Ambulatory Visit: Payer: Self-pay | Attending: Internal Medicine | Admitting: Internal Medicine

## 2023-06-20 ENCOUNTER — Encounter: Payer: Self-pay | Admitting: Internal Medicine

## 2023-08-26 ENCOUNTER — Ambulatory Visit: Payer: Self-pay | Admitting: Physician Assistant

## 2023-08-31 ENCOUNTER — Encounter: Payer: Self-pay | Admitting: Physician Assistant

## 2023-08-31 ENCOUNTER — Ambulatory Visit: Payer: Self-pay | Admitting: Physician Assistant

## 2023-08-31 VITALS — BP 132/82 | HR 84 | Temp 97.8°F | Ht 64.0 in | Wt 168.0 lb

## 2023-08-31 DIAGNOSIS — E038 Other specified hypothyroidism: Secondary | ICD-10-CM

## 2023-08-31 DIAGNOSIS — A029 Salmonella infection, unspecified: Secondary | ICD-10-CM | POA: Insufficient documentation

## 2023-08-31 DIAGNOSIS — Z131 Encounter for screening for diabetes mellitus: Secondary | ICD-10-CM | POA: Insufficient documentation

## 2023-08-31 DIAGNOSIS — E782 Mixed hyperlipidemia: Secondary | ICD-10-CM

## 2023-08-31 MED ORDER — CIPROFLOXACIN HCL 500 MG PO TABS: 500.0000 mg | ORAL_TABLET | Freq: Two times a day (BID) | ORAL | 0 refills | Status: DC

## 2023-08-31 NOTE — Assessment & Plan Note (Signed)
 Uncontrolled Does not take medication daily Encouraged to take daily Labs drawn today Continue to work on diet and exercise

## 2023-08-31 NOTE — Assessment & Plan Note (Addendum)
 Intermittent diarrhea for one month, possible infectious etiology due to recent similar symptoms in nephew. - Prescribed ciprofloxacin  twice daily for seven days. - If diarrhea persists, perform stool studies for bacterial or parasitic infection. - Report any consistent blood in stool.  Possible infection from infected food Sent antibiotic If no improvement we will order stool studies

## 2023-08-31 NOTE — Assessment & Plan Note (Signed)
 Ongoing management with no specific issues. - Order blood work to check thyroid  levels and adjust medication if necessary.

## 2023-08-31 NOTE — Patient Instructions (Signed)
 VISIT SUMMARY:  Today, you were seen for diarrhea and an upset stomach that you have been experiencing for the past month. We discussed your symptoms, including the occasional presence of blood in your stool, and reviewed your current medications and allergies.  YOUR PLAN:  -DIARRHEA: Diarrhea is the condition of having frequent and loose bowel movements. It can be caused by infections, certain foods, or other medical conditions. You have been prescribed ciprofloxacin  to take twice daily for seven days. If your diarrhea does not improve, we will need to perform stool studies to check for bacterial or parasitic infections. Please report any consistent blood in your stool.  -THYROID  DISORDER: A thyroid  disorder affects the thyroid  gland, which regulates your metabolism. We will continue to manage your thyroid  condition and have ordered blood work to check your thyroid  levels. Your medication may be adjusted based on the results.  -ALLERGY TO SULFA DRUGS: You have an allergy to sulfa antibiotics, which can cause adverse reactions. No new reactions have been noted.  -GENERAL HEALTH MAINTENANCE: Routine health maintenance is important for overall well-being. We have ordered blood work to check your cholesterol and A1c levels to monitor your general health.  INSTRUCTIONS:  Please take ciprofloxacin  as prescribed, twice daily for seven days. If your diarrhea persists or you notice consistent blood in your stool, contact us  immediately. Additionally, please complete the blood work for thyroid  levels, cholesterol, and A1c as ordered. Follow up with us  to review the results and adjust your treatment plan if necessary.

## 2023-08-31 NOTE — Progress Notes (Signed)
 Subjective:  Patient ID: Garrett Stone, male    DOB: 05/22/1970  Age: 53 y.o. MRN: [id number]  Chief Complaint  Patient presents with   Hypothyroidism    HPI: Discussed the use of AI scribe software for clinical note transcription with the patient, who gave verbal consent to proceed.  History of Present Illness   Garrett Stone is a 53 year old male who presents with diarrhea and upset stomach.  He has experienced diarrhea and an upset stomach for the past month, initially daily, now two to three times a week. Diarrhea occurs once daily, typically postprandial, with a sensation of stomach movement. Blood in the stool has been noted on two occasions. There is no associated pain, fever, nausea, or vomiting.  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 He is currently taking thyroid  medication and has an allergy to sulfa antibiotics.          08/31/2023   11:19 AM 07/16/2022    9:16 AM 01/06/2022   11:14 AM 12/31/2020    3:42 PM 03/21/2020    8:16 AM  Depression screen PHQ 2/9  Decreased Interest 0 0 0 0 0  Down, Depressed, Hopeless 0 0 0 0 0  PHQ - 2 Score 0 0 0 0 0  Altered sleeping  0     Tired, decreased energy  0     Change in appetite  0     Feeling bad or failure about yourself   0     Trouble concentrating  0     Moving slowly or fidgety/restless  0     PHQ-9 Score  0           08/31/2023   11:19 AM  Fall Risk   Falls in the past year? 0  Number falls in past yr: 0  Injury with Fall? 0  Risk for fall due to : No Fall Risks  Follow up Falls evaluation completed    Patient Care Team: Odilia Bennett, [place] as PCP - General (Physician Assistant)   Review of Systems  Constitutional:  Negative for appetite change, fatigue and fever.  HENT:  Negative for congestion, ear pain, sinus pressure and sore throat.   Respiratory:  Negative for cough, chest tightness, shortness of  breath and wheezing.   Cardiovascular:  Negative for chest pain and palpitations.  Gastrointestinal:  Positive for diarrhea (x1 month). Negative for abdominal pain, constipation, nausea and vomiting.  Genitourinary:  Negative for dysuria and hematuria.  Musculoskeletal:  Negative for arthralgias, back pain, joint swelling and myalgias.  Skin:  Negative for rash.  Neurological:  Negative for dizziness, weakness and headaches.  Psychiatric/Behavioral:  Negative for dysphoric mood. The patient is not nervous/anxious.     Current Outpatient Medications on File Prior to Visit  Medication Sig Dispense Refill   levothyroxine  (SYNTHROID ) 112 MCG tablet Take 1 tablet (112 mcg total) by mouth daily. 90 tablet 0   No current facility-administered medications on file prior to visit.   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 No pertinent family history. Social History  Socioeconomic History   Marital status: Single    Spouse name: Not on file   Number of children: 2   Years of education: Not on file   Highest education level: GED or equivalent  Occupational History   Occupation: [employer]  Tobacco Use   Smoking status: Never   Smokeless tobacco: Never  Vaping Use   Vaping status: Never Used  Substance and Sexual Activity   Alcohol use: Yes    Alcohol/week: 1.0 standard drink of alcohol    Types: 1 Cans of beer per week    Comment: Drinks alcohol on a social basis only.   Drug use: Never   Sexual activity: Not Currently  Other Topics Concern   Not on file  Social History Narrative   Not on file   Social Drivers of Health   Financial Resource Strain: Low Risk  (08/30/2023)    Overall Financial Resource Strain (CARDIA)    Difficulty of Paying Living Expenses: Not very hard  Food Insecurity: Food Insecurity Present (08/30/2023)   Hunger Vital Sign    Worried About Running Out of Food in the Last Year: Often true    Ran Out of Food in the Last Year: Often true  Transportation Needs: Unmet Transportation Needs (08/30/2023)   PRAPARE - Administrator, Civil Service (Medical): Yes    Lack of Transportation (Non-Medical): Yes  Physical Activity: Unknown (08/30/2023)   Exercise Vital Sign    Days of Exercise per Week: 0 days    Minutes of Exercise per Session: Not on file  Stress: No Stress Concern Present (08/30/2023)   [employer] of Occupational Health - Occupational Stress Questionnaire    Feeling of Stress : Not at all  Social Connections: Moderately Isolated (08/30/2023)   Social Connection and Isolation Panel [NHANES]    Frequency of Communication with Friends and Family: More than three times a week    Frequency of Social Gatherings with Friends and Family: Once a week    Attends Religious Services: More than 4 times per year    Active Member of [employer] or Organizations: No    Attends Engineer, structural: Not on file    Marital Status: Divorced    Objective:  BP 132/82 (BP Location: Left Arm, Patient Position: Sitting)   Pulse 84   Temp 97.8 F (36.6 C) (Temporal)   Ht 5\' 4"  (1.626 m)   Wt 168 lb (76.2 kg)   SpO2 99%   BMI 28.84 kg/m      08/31/2023   11:18 AM 03/11/2023   12:16 PM 01/21/2023    8:03 AM  BP/Weight  Systolic BP 132 118 100  Diastolic BP 82 82 60  Wt. (Lbs) 168 164.8 163  BMI 28.84 kg/m2 28.29 kg/m2 27.12 kg/m2    Physical Exam Vitals reviewed.  Constitutional:      Appearance: Normal appearance.  Cardiovascular:     Rate and Rhythm: Normal rate and regular rhythm.     Heart sounds: Normal heart sounds.  Pulmonary:     Effort: Pulmonary effort is normal.     Breath sounds: Normal breath sounds.   Abdominal:     General: Bowel sounds are normal.     Palpations: Abdomen is soft.     Tenderness: There is no abdominal tenderness.  Neurological:     Mental Status: He is alert and oriented to person, place, and time.  Psychiatric:        Mood and Affect: Mood normal.  Behavior: Behavior normal.     Diabetic Foot Exam - Simple   No data filed      Lab Results  Component Value Date   WBC 6.1 01/21/2023   HGB 14.8 01/21/2023   HCT 45.7 01/21/2023   PLT 332 01/21/2023   GLUCOSE 107 (H) 01/21/2023   CHOL 260 (H) 01/21/2023   TRIG 435 (H) 01/21/2023   HDL 39 (L) 01/21/2023   LDLCALC 141 (H) 01/21/2023   ALT 35 01/21/2023   AST 23 01/21/2023   NA 141 01/21/2023   K 4.5 01/21/2023   CL 103 01/21/2023   CREATININE 0.95 01/21/2023   BUN 14 01/21/2023   CO2 22 01/21/2023   TSH 2.170 01/21/2023      Assessment & Plan:  Other specified hypothyroidism Assessment & Plan: Ongoing management with no specific issues. - Order blood work to check thyroid  levels and adjust medication if necessary.  Orders: -     TSH  Mixed hyperlipidemia Assessment & Plan: Uncontrolled Does not take medication daily Encouraged to take daily Labs drawn today Continue to work on diet and exercise  Orders: -     CBC with Differential/Platelet -     Comprehensive metabolic panel with GFR -     Lipid panel  Salmonella food poisoning Assessment & Plan: Intermittent diarrhea for one month, possible infectious etiology due to recent similar symptoms in nephew. - Prescribed ciprofloxacin  twice daily for seven days. - If diarrhea persists, perform stool studies for bacterial or parasitic infection. - Report any consistent blood in stool.  Possible infection from infected food Sent antibiotic If no improvement we will order stool studies  Orders: -     Ciprofloxacin  HCl; Take 1 tablet (500 mg total) by mouth 2 (two) times daily.  Dispense: 14 tablet; Refill: 0  Screening for  diabetes mellitus -     Hemoglobin A1c     Meds ordered this encounter  Medications   ciprofloxacin  (CIPRO ) 500 MG tablet    Sig: Take 1 tablet (500 mg total) by mouth 2 (two) times daily.    Dispense:  14 tablet    Refill:  0    Orders Placed This Encounter  Procedures   CBC with Differential/Platelet   Comprehensive metabolic panel with GFR   Lipid panel   TSH   Hemoglobin A1c     General Health Maintenance Routine health maintenance with planned monitoring. - Order blood work to check cholesterol and A1c levels.          Follow-up: Return in about 6 months (around 03/01/2024) for Chronic, Mirian Ames.   I,Lauren M Auman,acting as a Neurosurgeon for [employer], PA.,have documented all relevant documentation on the behalf of Odilia Bennett, PA,as directed by  Odilia Bennett, PA while in the presence of Odilia Bennett, [place].   An After Visit Summary was printed and given to the patient.  Odilia Bennett, [place] [facility] [phone]

## 2023-09-01 ENCOUNTER — Encounter: Payer: Self-pay | Admitting: Physician Assistant

## 2023-09-01 LAB — CBC WITH DIFFERENTIAL/PLATELET
Basophils Absolute: 0.1 10*3/uL (ref 0.0–0.2)
Basos: 1 %
EOS (ABSOLUTE): 0.1 10*3/uL (ref 0.0–0.4)
Eos: 1 %
Hematocrit: 43.5 % (ref 37.5–51.0)
Hemoglobin: 14.3 g/dL (ref 13.0–17.7)
Immature Grans (Abs): 0 10*3/uL (ref 0.0–0.1)
Immature Granulocytes: 1 %
Lymphocytes Absolute: 1.9 10*3/uL (ref 0.7–3.1)
Lymphs: 30 %
MCH: 28.8 pg (ref 26.6–33.0)
MCHC: 32.9 g/dL (ref 31.5–35.7)
MCV: 88 fL (ref 79–97)
Monocytes Absolute: 0.5 10*3/uL (ref 0.1–0.9)
Monocytes: 8 %
Neutrophils Absolute: 3.7 10*3/uL (ref 1.4–7.0)
Neutrophils: 59 %
Platelets: 305 10*3/uL (ref 150–450)
RBC: 4.97 x10E6/uL (ref 4.14–5.80)
RDW: 13.5 % (ref 11.6–15.4)
WBC: 6.3 10*3/uL (ref 3.4–10.8)

## 2023-09-01 LAB — LIPID PANEL
Chol/HDL Ratio: 11.3 ratio — ABNORMAL HIGH (ref 0.0–5.0)
Cholesterol, Total: 351 mg/dL — ABNORMAL HIGH (ref 100–199)
HDL: 31 mg/dL — ABNORMAL LOW (ref >39)
LDL Chol Calc (NIH): 175 mg/dL — ABNORMAL HIGH (ref 0–99)
Triglycerides: 690 mg/dL (ref 0–149)
VLDL Cholesterol Cal: 145 mg/dL — ABNORMAL HIGH (ref 5–40)

## 2023-09-01 LAB — COMPREHENSIVE METABOLIC PANEL WITH GFR
ALT: 50 IU/L — ABNORMAL HIGH (ref 0–44)
AST: 29 IU/L (ref 0–40)
Albumin: 4.6 g/dL (ref 3.8–4.9)
Alkaline Phosphatase: 89 IU/L (ref 44–121)
BUN/Creatinine Ratio: 18 (ref 9–20)
BUN: 17 mg/dL (ref 6–24)
Bilirubin Total: 0.4 mg/dL (ref 0.0–1.2)
CO2: 22 mmol/L (ref 20–29)
Calcium: 9.7 mg/dL (ref 8.7–10.2)
Chloride: 102 mmol/L (ref 96–106)
Creatinine, Ser: 0.93 mg/dL (ref 0.76–1.27)
Globulin, Total: 2.9 g/dL (ref 1.5–4.5)
Glucose: 104 mg/dL — ABNORMAL HIGH (ref 70–99)
Potassium: 4.4 mmol/L (ref 3.5–5.2)
Sodium: 135 mmol/L (ref 134–144)
Total Protein: 7.5 g/dL (ref 6.0–8.5)
eGFR: 98 mL/min/{1.73_m2} (ref >59)

## 2023-09-01 LAB — HEMOGLOBIN A1C
Est. average glucose Bld gHb Est-mCnc: 128 mg/dL
Hgb A1c MFr Bld: 6.1 % — ABNORMAL HIGH (ref 4.8–5.6)

## 2023-09-01 LAB — TSH: TSH: 2.65 u[IU]/mL (ref 0.450–4.500)

## 2023-09-06 ENCOUNTER — Other Ambulatory Visit: Payer: Self-pay

## 2023-09-06 MED ORDER — EZETIMIBE 10 MG PO TABS: 10.0000 mg | ORAL_TABLET | Freq: Every day | ORAL | 0 refills | Status: DC

## 2023-09-23 IMAGING — CR DG SHOULDER 2+V*L*
3 series · 3 of 3 positions shown · non-contrast
Comparison: None.

CLINICAL DATA: Fall

EXAM:
LEFT SHOULDER - 2+ VIEW

[shoulder grashey]
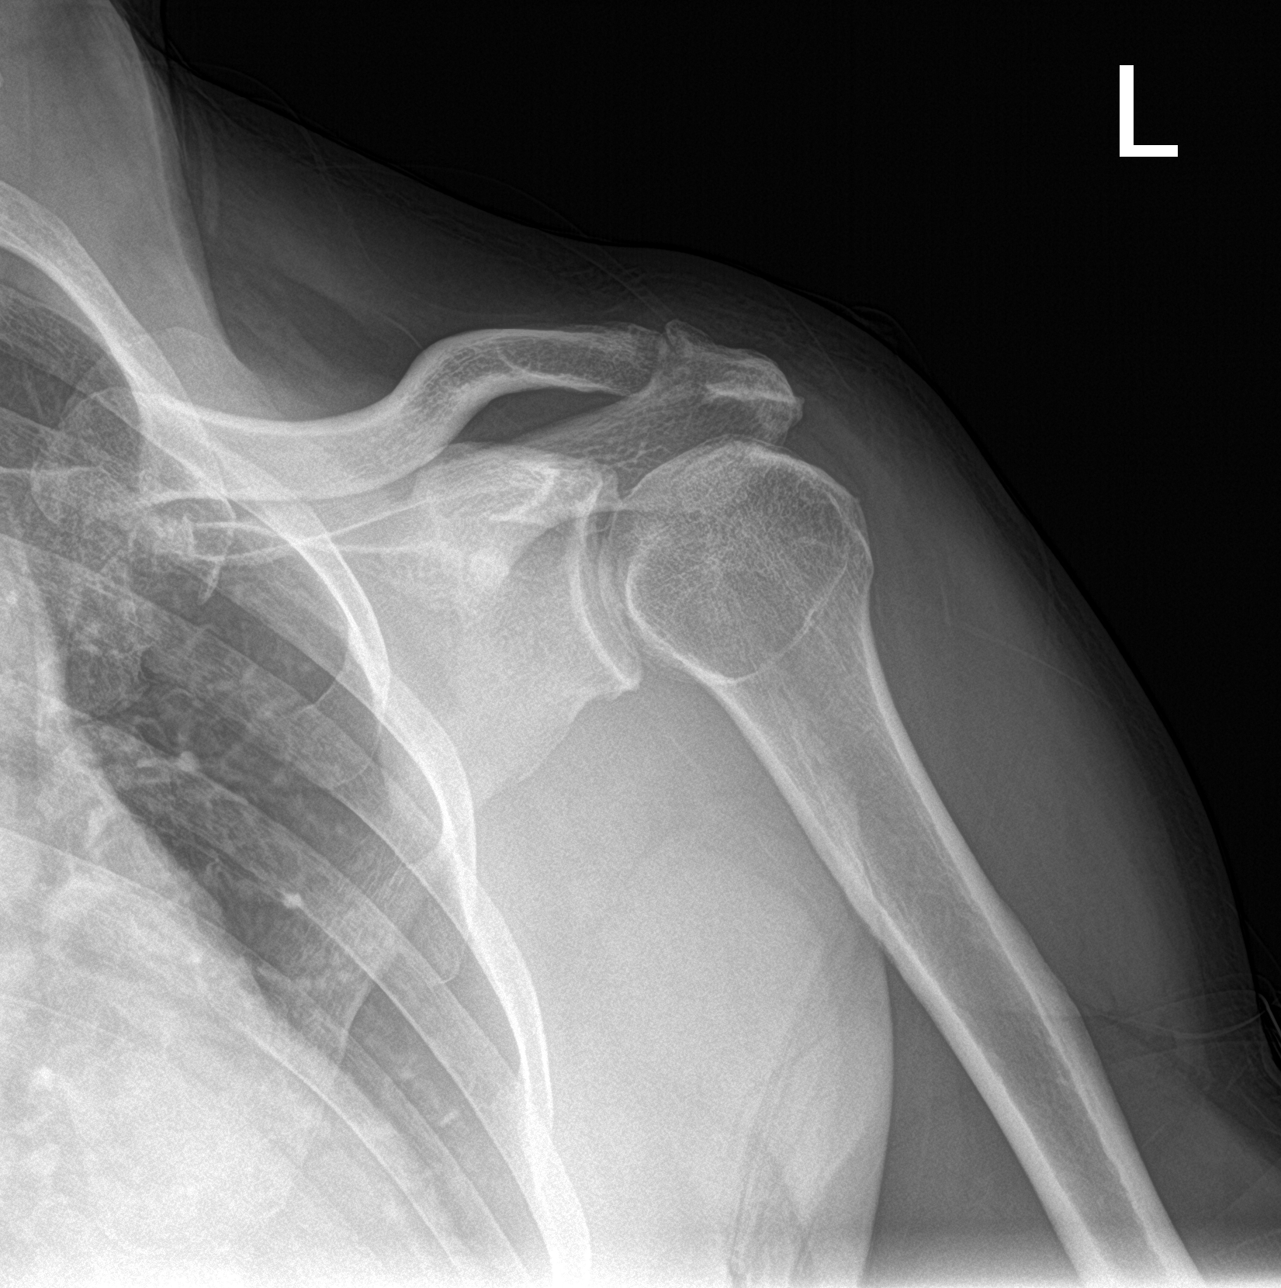

[shoulder y view]
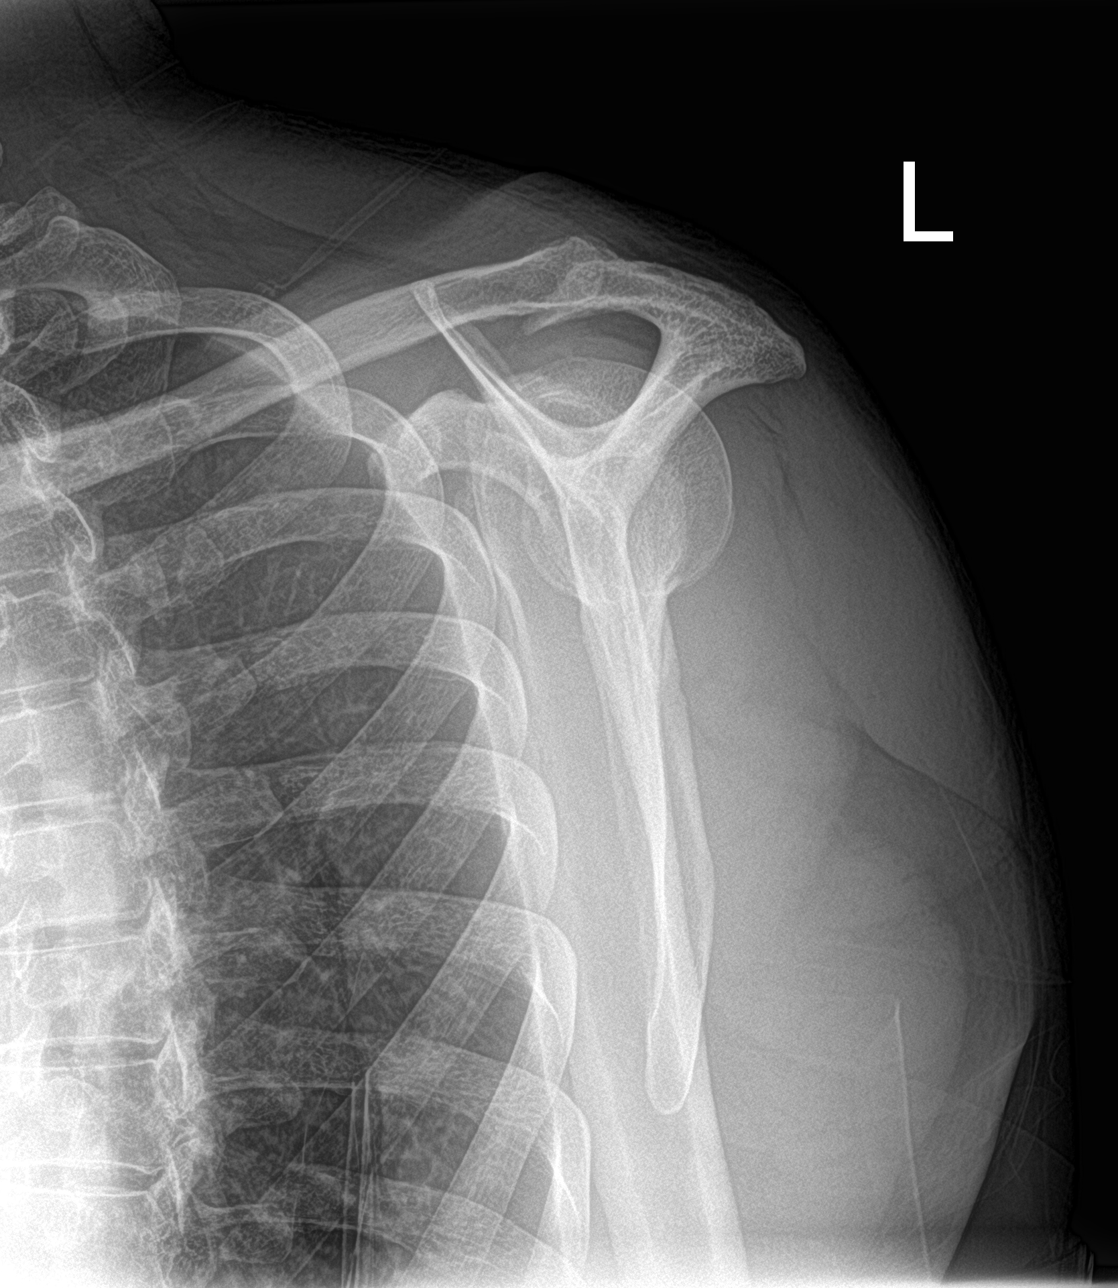

[shoulder ap neutral]
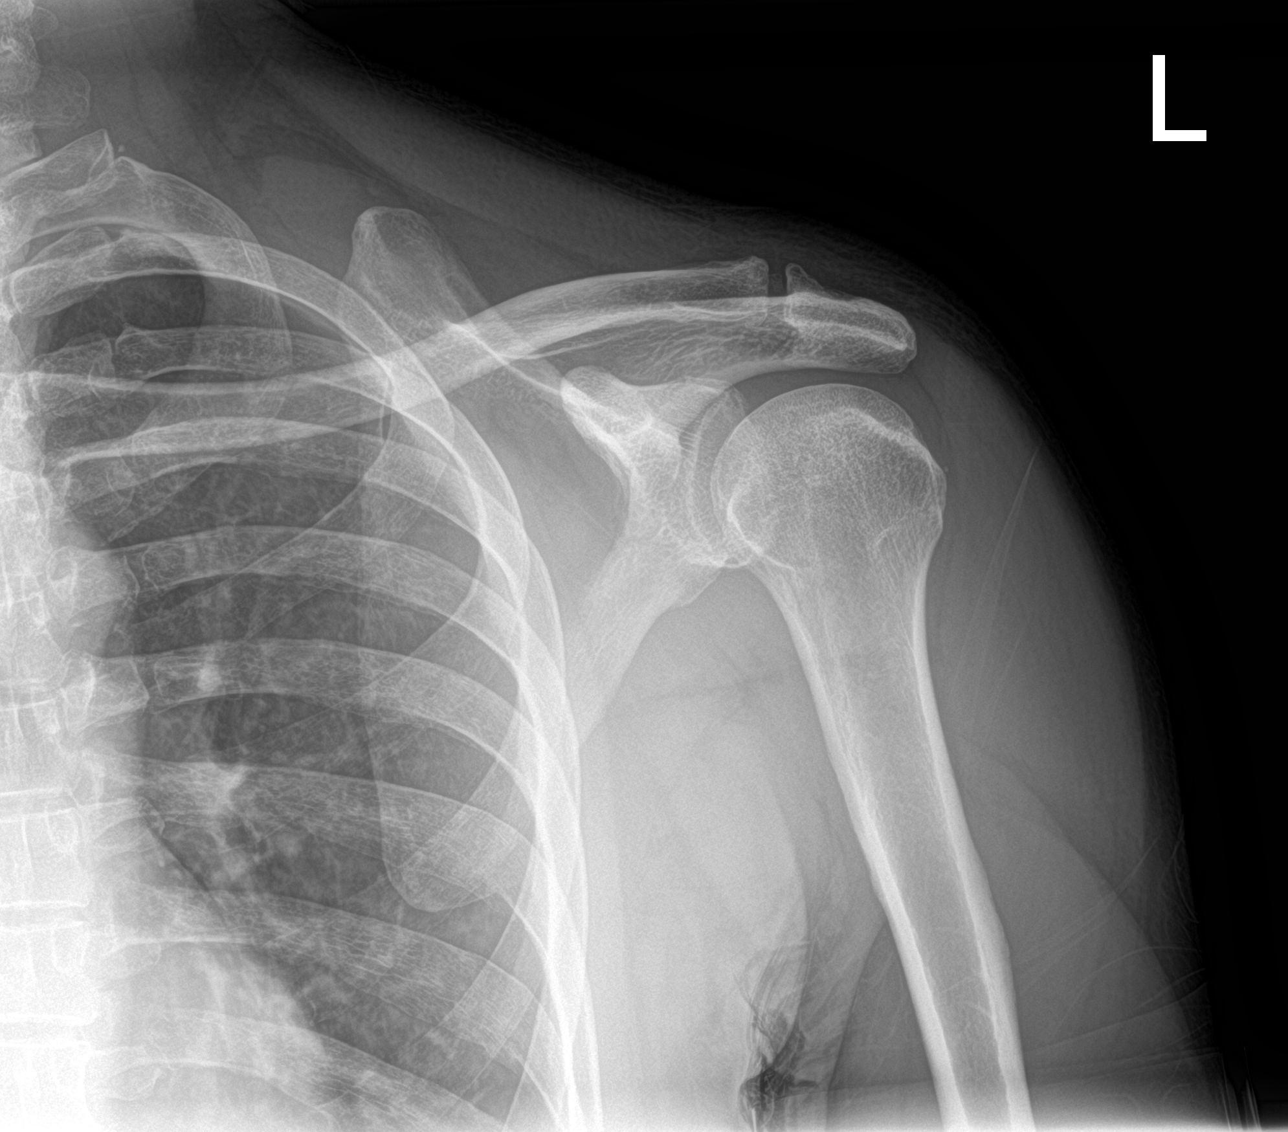

[3 of 3 positions shown; findings below may reference images not displayed]

FINDINGS: There is no evidence of acute fracture or dislocation. There is mild
glenohumeral and moderate AC joint degenerative change.
IMPRESSION: No acute fracture or dislocation.

## 2023-12-05 ENCOUNTER — Other Ambulatory Visit: Payer: Self-pay | Admitting: Physician Assistant

## 2023-12-05 DIAGNOSIS — E038 Other specified hypothyroidism: Secondary | ICD-10-CM

## 2023-12-05 MED ORDER — LEVOTHYROXINE SODIUM 112 MCG PO TABS
112.0000 ug | ORAL_TABLET | Freq: Every day | ORAL | 0 refills | Status: DC
Start: 2023-12-05 — End: 2023-12-16

## 2023-12-05 NOTE — Telephone Encounter (Signed)
 Copied from CRM #[id number]. Topic: Clinical - Medication Refill >> Dec 05, 2023  2:00 PM Emylou G wrote: Medication:  levothyroxine  (SYNTHROID ) 112 MCG tablet    Has the patient contacted their pharmacy? Yes (Agent: If no, request that the patient contact the pharmacy for the refill. If patient does not wish to contact the pharmacy document the reason why and proceed with request.) (Agent: If yes, when and what did the pharmacy advise?) said to call us   This is the patient's preferred pharmacy:   [facility] [address], [place] - [address] [place] [address] Phone: [phone] Fax: [phone]  Is this the correct pharmacy for this prescription? Yes If no, delete pharmacy and type the correct one.   Has the prescription been filled recently? No  Is the patient out of the medication? yes  Has the patient been seen for an appointment in the last year OR does the patient have an upcoming appointment? Yes  Can we respond through MyChart? Yes  Agent: Please be advised that Rx refills may take up to 3 business days. We ask that you follow-up with your pharmacy.

## 2023-12-16 ENCOUNTER — Ambulatory Visit (INDEPENDENT_AMBULATORY_CARE_PROVIDER_SITE_OTHER): Payer: Self-pay | Admitting: Physician Assistant

## 2023-12-16 VITALS — BP 130/70 | HR 75 | Temp 97.4°F | Resp 16 | Ht 64.0 in | Wt 164.0 lb

## 2023-12-16 DIAGNOSIS — E782 Mixed hyperlipidemia: Secondary | ICD-10-CM

## 2023-12-16 DIAGNOSIS — E038 Other specified hypothyroidism: Secondary | ICD-10-CM

## 2023-12-16 DIAGNOSIS — R7303 Prediabetes: Secondary | ICD-10-CM | POA: Insufficient documentation

## 2023-12-16 MED ORDER — EZETIMIBE 10 MG PO TABS
10.0000 mg | ORAL_TABLET | Freq: Every day | ORAL | 3 refills | Status: AC
Start: 2023-12-16 — End: ?

## 2023-12-16 MED ORDER — LEVOTHYROXINE SODIUM 112 MCG PO TABS
112.0000 ug | ORAL_TABLET | Freq: Every day | ORAL | 3 refills | Status: DC
Start: 2023-12-16 — End: 2024-03-23

## 2023-12-16 NOTE — Patient Instructions (Signed)
 VISIT SUMMARY:  Today, we discussed your recent experience with shakiness in the mornings, which started after you stopped taking your thyroid  medication for a few days. We also reviewed your history of finger injuries and your challenges with insurance coverage for medical treatments.  YOUR PLAN:  -HYPOTHYROIDISM: Hypothyroidism is a condition where your thyroid  gland does not produce enough thyroid  hormone. This can cause symptoms like shakiness, especially if you stop taking your medication. We will order thyroid  function tests to see if your medication needs adjustment.  -HYPERLIPIDEMIA: Hyperlipidemia means you have high levels of fats (lipids) in your blood, which can increase your risk of heart disease. Your cardiac calcium  score from last year was normal, but you should continue taking Zetia  to help prevent plaque buildup in your arteries.  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 No new treatment is needed at this time.  INSTRUCTIONS:  Please follow up with the thyroid  function tests as soon as possible. Continue taking Zetia  as prescribed. If you experience any new symptoms or have concerns, schedule an appointment.

## 2023-12-16 NOTE — Assessment & Plan Note (Signed)
 Reports morning shakiness after discontinuing thyroid medication for 2-3 days. - Order thyroid function tests to assess need for medication adjustment.

## 2023-12-16 NOTE — Progress Notes (Signed)
 Subjective:  Patient ID: Garrett Stone, male    DOB: 07/28/1970  Age: 53 y.o. MRN: [id number]  Chief Complaint  Patient presents with   Medical Management of Chronic Issues    HPI:  Discussed the use of AI scribe software for clinical note transcription with the patient, who gave verbal consent to proceed.  History of Present Illness   Garrett Stone is a 52 year old male who presents with issues related to thyroid  medication discontinuation.  He has been experiencing a sensation of shakiness in his body, particularly in the mornings, described as a 'tremor'. This began after he stopped taking his thyroid  medication for two to three days. No headaches, dizziness, or chest pain.  He mentions a past incident of numbness in his fingers, specifically when placing his hand in certain positions. This numbness lasted for a couple of months but has since resolved. He reports that the numbness began after a fall while playing soccer, during which he landed on his hand.  He has a history of a finger injury from eight years ago, where he cut his finger with a lawnmower. He attempted to reattach the severed piece himself, which led to an infection requiring surgery and a month of antibiotics. He currently has no feeling in that finger.  He discusses financial challenges related to insurance coverage, including high monthly premiums and copayments, which have impacted his ability to access certain medical treatments, such as a CPAP machine for which insurance did not cover the cost.          12/16/2023    9:34 AM 08/31/2023   11:19 AM 07/16/2022    9:16 AM 01/06/2022   11:14 AM 12/31/2020    3:42 PM  Depression screen PHQ 2/9  Decreased Interest 0 0 0 0 0  Down, Depressed, Hopeless 0 0 0 0 0  PHQ - 2 Score 0 0 0 0 0  Altered sleeping 0  0    Tired, decreased energy 0  0    Change in appetite 0  0    Feeling bad or failure about yourself  0  0    Trouble concentrating 0  0     Moving slowly or fidgety/restless 0  0    Suicidal thoughts 0      PHQ-9 Score 0  0    Difficult doing work/chores Not difficult at all            12/16/2023    9:34 AM  Fall Risk   Falls in the past year? 0  Number falls in past yr: 0  Injury with Fall? 0  Risk for fall due to : No Fall Risks  Follow up Falls evaluation completed    Patient Care Team: Milon Cleaves, [place] as PCP - General (Physician Assistant)   Review of Systems  Constitutional:  Negative for chills, fatigue, fever and unexpected weight change.  HENT:  Negative for congestion, ear pain, sinus pain and sore throat.   Respiratory:  Negative for cough and shortness of breath.   Cardiovascular:  Negative for chest pain and palpitations.  Gastrointestinal:  Negative for abdominal pain, blood in stool, constipation, diarrhea, nausea and vomiting.  Endocrine: Negative for polydipsia.  Genitourinary:  Negative for dysuria.  Musculoskeletal:  Negative for back pain.  Skin:  Negative for rash.  Neurological:  Negative for headaches.    No current outpatient medications on file prior to visit.   No current facility-administered medications on file prior  to visit.   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 No pertinent family history. Social History   Socioeconomic History   Marital status: Single    Spouse name: Not on file   Number of children: 2   Years of education: Not on file   Highest education level: GED or equivalent  Occupational History   Occupation: [employer]  Tobacco Use   Smoking status: Never   Smokeless tobacco: Never  Vaping Use   Vaping status: Never Used  Substance and Sexual Activity    Alcohol use: Yes    Alcohol/week: 1.0 standard drink of alcohol    Types: 1 Cans of beer per week    Comment: Drinks alcohol on a social basis only.   Drug use: Never   Sexual activity: Not Currently  Other Topics Concern   Not on file  Social History Narrative   Not on file   Social Drivers of Health   Financial Resource Strain: Low Risk  (12/16/2023)   Overall Financial Resource Strain (CARDIA)    Difficulty of Paying Living Expenses: Not hard at all  Food Insecurity: Food Insecurity Present (12/16/2023)   Hunger Vital Sign    Worried About Running Out of Food in the Last Year: Often true    Ran Out of Food in the Last Year: Sometimes true  Transportation Needs: Unmet Transportation Needs (12/16/2023)   PRAPARE - Administrator, Civil Service (Medical): Yes    Lack of Transportation (Non-Medical): Yes  Physical Activity: Sufficiently Active (12/16/2023)   Exercise Vital Sign    Days of Exercise per Week: 6 days    Minutes of Exercise per Session: 50 min  Stress: No Stress Concern Present (12/16/2023)   [employer] of Occupational Health - Occupational Stress Questionnaire    Feeling of Stress: Only a little  Social Connections: Moderately Isolated (12/16/2023)   Social Connection and Isolation Panel    Frequency of Communication with Friends and Family: More than three times a week    Frequency of Social Gatherings with Friends and Family: More than three times a week    Attends Religious Services: More than 4 times per year    Active Member of [employer] or Organizations: No    Attends Engineer, structural: Not on file    Marital Status: Divorced    Objective:  BP 130/70   Pulse 75   Temp (!) 97.4 F (36.3 C)   Resp 16   Ht 5' 4 (1.626 m)   Wt 164 lb (74.4 kg)   SpO2 97%   BMI 28.15 kg/m      12/16/2023    9:06 AM 08/31/2023   11:18 AM 03/11/2023   12:16 PM  BP/Weight  Systolic BP 130 132 118  Diastolic BP 70 82 82  Wt. (Lbs) 164 168 164.8   BMI 28.15 kg/m2 28.84 kg/m2 28.29 kg/m2    Physical Exam Vitals reviewed.  Constitutional:      Appearance: Normal appearance.  Cardiovascular:     Rate and Rhythm: Normal rate and regular rhythm.     Heart sounds: Normal heart sounds.  Pulmonary:     Effort: Pulmonary effort  is normal.     Breath sounds: Normal breath sounds.  Abdominal:     General: Bowel sounds are normal.     Palpations: Abdomen is soft.     Tenderness: There is no abdominal tenderness.  Skin:    General: Skin is warm.     Findings: Laceration present.     Comments: Minor laceration due to working on a car on at the DIP of the 3rd finger on the right hand.   Neurological:     Mental Status: He is alert and oriented to person, place, and time.  Psychiatric:        Mood and Affect: Mood normal.        Behavior: Behavior normal.         Lab Results  Component Value Date   WBC 6.3 08/31/2023   HGB 14.3 08/31/2023   HCT 43.5 08/31/2023   PLT 305 08/31/2023   GLUCOSE 104 (H) 08/31/2023   CHOL 351 (H) 08/31/2023   TRIG 690 (HH) 08/31/2023   HDL 31 (L) 08/31/2023   LDLCALC 175 (H) 08/31/2023   ALT 50 (H) 08/31/2023   AST 29 08/31/2023   NA 135 08/31/2023   K 4.4 08/31/2023   CL 102 08/31/2023   CREATININE 0.93 08/31/2023   BUN 17 08/31/2023   CO2 22 08/31/2023   TSH 2.650 08/31/2023   HGBA1C 6.1 (H) 08/31/2023      Assessment & Plan:  Mixed hyperlipidemia Assessment & Plan: Cardiac calcium  score from last year was normal. High cholesterol remains a risk factor for heart disease. - Continue Zetia  to prevent progression of plaque buildup. Lab Results  Component Value Date   LDLCALC 175 (H) 08/31/2023     Orders: -     CBC with Differential/Platelet -     Comprehensive metabolic panel with GFR -     Lipid panel -     Ezetimibe ; Take 1 tablet (10 mg total) by mouth daily.  Dispense: 90 tablet; Refill: 3  Other specified hypothyroidism Assessment & Plan: Reports morning shakiness  after discontinuing thyroid  medication for 2-3 days. - Order thyroid  function tests to assess need for medication adjustment.  Orders: -     T4, free -     TSH -     Levothyroxine  Sodium; Take 1 tablet (112 mcg total) by mouth daily.  Dispense: 90 tablet; Refill: 3  Prediabetes Assessment & Plan: Labs drawn today Continue to monitor diet and exercise Will adjust treatment based on results Lab Results  Component Value Date   HGBA1C 6.1 (H) 08/31/2023     Orders: -     Hemoglobin A1c     Meds ordered this encounter  Medications   levothyroxine  (SYNTHROID ) 112 MCG tablet    Sig: Take 1 tablet (112 mcg total) by mouth daily.    Dispense:  90 tablet    Refill:  3   ezetimibe  (ZETIA ) 10 MG tablet    Sig: Take 1 tablet (10 mg total) by mouth daily.    Dispense:  90 tablet    Refill:  3    Orders Placed This Encounter  Procedures   CBC with Differential/Platelet   Comprehensive metabolic panel with GFR   Hemoglobin A1c   Lipid panel   T4, free   TSH     Follow-up: No follow-ups on file.   I,Marla I Leal-Borjas,acting as a scribe for [employer], PA.,have documented all relevant documentation on the behalf of Nola Angles, PA,as directed by  Nola Angles, PA while in the presence of [place], [place].   An After Visit Summary was printed and given to the patient.  Nola Angles, [place] [facility] [phone]

## 2023-12-16 NOTE — Assessment & Plan Note (Signed)
 Labs drawn today Continue to monitor diet and exercise Will adjust treatment based on results Lab Results  Component Value Date   HGBA1C 6.1 (H) 08/31/2023

## 2023-12-16 NOTE — Assessment & Plan Note (Signed)
 Cardiac calcium  score from last year was normal. High cholesterol remains a risk factor for heart disease. - Continue Zetia  to prevent progression of plaque buildup. Lab Results  Component Value Date   LDLCALC 175 (H) 08/31/2023

## 2023-12-18 LAB — CBC WITH DIFFERENTIAL/PLATELET
Basophils Absolute: 0.1 x10E3/uL (ref 0.0–0.2)
Basos: 2 %
EOS (ABSOLUTE): 0.1 x10E3/uL (ref 0.0–0.4)
Eos: 2 %
Hematocrit: 47.9 % (ref 37.5–51.0)
Hemoglobin: 15.7 g/dL (ref 13.0–17.7)
Immature Grans (Abs): 0 x10E3/uL (ref 0.0–0.1)
Immature Granulocytes: 0 %
Lymphocytes Absolute: 2.5 x10E3/uL (ref 0.7–3.1)
Lymphs: 41 %
MCH: 29.3 pg (ref 26.6–33.0)
MCHC: 32.8 g/dL (ref 31.5–35.7)
MCV: 89 fL (ref 79–97)
Monocytes Absolute: 0.4 x10E3/uL (ref 0.1–0.9)
Monocytes: 7 %
Neutrophils Absolute: 3 x10E3/uL (ref 1.4–7.0)
Neutrophils: 48 %
Platelets: 326 x10E3/uL (ref 150–450)
RBC: 5.36 x10E6/uL (ref 4.14–5.80)
RDW: 13.3 % (ref 11.6–15.4)
WBC: 6.2 x10E3/uL (ref 3.4–10.8)

## 2023-12-18 LAB — COMPREHENSIVE METABOLIC PANEL WITH GFR
ALT: 33 IU/L (ref 0–44)
AST: 53 IU/L — ABNORMAL HIGH (ref 0–40)
Albumin: 4.6 g/dL (ref 3.8–4.9)
Alkaline Phosphatase: 90 IU/L (ref 44–121)
BUN/Creatinine Ratio: 18 (ref 9–20)
BUN: 17 mg/dL (ref 6–24)
Bilirubin Total: 0.4 mg/dL (ref 0.0–1.2)
CO2: 18 mmol/L — ABNORMAL LOW (ref 20–29)
Calcium: 9.4 mg/dL (ref 8.7–10.2)
Chloride: 100 mmol/L (ref 96–106)
Creatinine, Ser: 0.95 mg/dL (ref 0.76–1.27)
Globulin, Total: 3.1 g/dL (ref 1.5–4.5)
Glucose: 111 mg/dL — ABNORMAL HIGH (ref 70–99)
Potassium: 4.2 mmol/L (ref 3.5–5.2)
Sodium: 135 mmol/L (ref 134–144)
Total Protein: 7.7 g/dL (ref 6.0–8.5)
eGFR: 95 mL/min/1.73 (ref >59)

## 2023-12-18 LAB — LIPID PANEL
Chol/HDL Ratio: 20.5 ratio — ABNORMAL HIGH (ref 0.0–5.0)
Cholesterol, Total: 431 mg/dL — ABNORMAL HIGH (ref 100–199)
HDL: 21 mg/dL — ABNORMAL LOW (ref >39)
Triglycerides: 2194 mg/dL (ref 0–149)

## 2023-12-18 LAB — TSH: TSH: 1.59 u[IU]/mL (ref 0.450–4.500)

## 2023-12-18 LAB — HEMOGLOBIN A1C
Est. average glucose Bld gHb Est-mCnc: 117 mg/dL
Hgb A1c MFr Bld: 5.7 % — ABNORMAL HIGH (ref 4.8–5.6)

## 2023-12-18 LAB — T4, FREE: Free T4: 1.69 ng/dL (ref 0.82–1.77)

## 2023-12-19 ENCOUNTER — Ambulatory Visit: Payer: Self-pay | Admitting: Physician Assistant

## 2023-12-19 DIAGNOSIS — E781 Pure hyperglyceridemia: Secondary | ICD-10-CM

## 2023-12-19 MED ORDER — FENOFIBRATE 145 MG PO TABS
145.0000 mg | ORAL_TABLET | Freq: Every day | ORAL | 3 refills | Status: AC
Start: 2023-12-19 — End: ?

## 2024-02-15 ENCOUNTER — Encounter (HOSPITAL_BASED_OUTPATIENT_CLINIC_OR_DEPARTMENT_OTHER): Payer: Self-pay | Admitting: Nurse Practitioner

## 2024-02-15 NOTE — Progress Notes (Deleted)
 Cardiology Office Note   Date:  02/15/2024  ID:  Garrett Stone, DOB 12-04-1970, MRN [id number] PCP: Milon Cleaves, PA  [facility] Providers Cardiologist:  None { Click to update primary MD,subspecialty MD or APP then REFRESH:1}    PMH Dyslipidemia Hypertriglyceridemia Hypothyroidism Statin myalgia Coronary calcium  score of 0 on CT 07/30/2022  Referred to advanced lipid disorders clinic and seen by Dr. Mona 03/16/2022.  He is a Spanish-speaking male assisted by professional Engineer, structural.  History of particularly high triglycerides with lipid panel at that time showing total cholesterol 287, triglycerides 1181, HDL 25 and LDL not calculated.  Diagnosis of high cholesterol for about 15 years.  Was previously on rosuvastatin  but it caused muscle aches.  Will switch to Zetia  but he has not been compliant.  History of hypothyroidism but admits he has not been compliant with his thyroid  medication he restarted it recently and TSH two months prior was 6.57.  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  He was working on improving diet by reducing saturated fats and sugars.  Reported some fatigue that have been progressive over the previous year.  Has been told that he snores and has woken up several times gasping for breath noting that his heart was racing.  EKG showed NSR with nonspecific T wave changes.  No family members with early CAD to his awareness.  He was advised to start atorvastatin  40 mg daily.  [facility] sleep study revealed moderate OSA and was supposed to be fitted for CPAP but insurance denied it.  At follow-up visit, he reported he was not taking atorvastatin  and ezetimibe  as prescribed.  LP(a) was negative at 20.1 nmol/L, APO B was elevated at 152.  LDL particle number 11/2022 1763, LDL-C 156, triglycerides 378, total cholesterol 264, HDL 37, and small LDL-P  1331.  Last lipid clinic visit was 03/11/2023 with Dr. Mona.  Lipids remained not well-controlled and he reported noncompliance with his medications.  Recommendation for medication compliance and repeat lipid testing in 3 months.  Lipid panel completed 12/16/2023 revealed total cholesterol 431, triglycerides 2194, HDL 21, and unable to calculate LDL.  PCP strongly encouraged compliance with medications. Medications are listed as fenofibrate  145 mg daily, ezetimibe  10 mg daily and levothyroxine  112 mcg daily.   History of Present Illness Garrett Stone is a 53 y.o. male ***  ROS: ***  Studies Reviewed      ***  Lipoprotein (a)  Date/Time Value Ref Range Status  06/24/2022 07:14 AM 20.1 <75.0 nmol/L Final    Comment:    Note:  Values greater than or equal to 75.0 nmol/L may        indicate an independent risk factor for CHD,        but must be evaluated with caution when applied        to non-Caucasian populations due to the        influence of genetic factors on Lp(a) across        ethnicities.     Risk Assessment/Calculations {Does this patient have ATRIAL FIBRILLATION?[phone]} No BP recorded.  {Refresh Note OR Click here to enter BP  :1}***   STOP-Bang Score:     { Consider Dx Sleep Disordered Breathing or Sleep Apnea  ICD G47.33          :1}    Physical Exam VS:  There were no vitals  taken for this visit.   Wt Readings from Last 3 Encounters:  12/16/23 164 lb (74.4 kg)  08/31/23 168 lb (76.2 kg)  03/11/23 164 lb 12.8 oz (74.8 kg)    GEN: Well nourished, well developed in no acute distress NECK: No JVD; No carotid bruits CARDIAC: ***RRR, no murmurs, rubs, gallops RESPIRATORY:  Clear to auscultation without rales, wheezing or rhonchi  ABDOMEN: Soft, non-tender, non-distended EXTREMITIES:  No edema; No deformity   ASSESSMENT AND PLAN ***    {Are you ordering a CV Procedure (e.g. stress test, cath, DCCV, TEE, etc)?   Press F2        :[id number]}  Dispo:  ***  Signed, Rosaline Bane, NP-C

## 2024-03-02 ENCOUNTER — Ambulatory Visit: Payer: Self-pay | Admitting: Physician Assistant

## 2024-03-07 ENCOUNTER — Encounter (HOSPITAL_BASED_OUTPATIENT_CLINIC_OR_DEPARTMENT_OTHER): Payer: Self-pay

## 2024-03-23 ENCOUNTER — Other Ambulatory Visit: Payer: Self-pay

## 2024-03-23 ENCOUNTER — Ambulatory Visit: Payer: Self-pay | Admitting: Physician Assistant

## 2024-03-23 ENCOUNTER — Telehealth: Payer: Self-pay

## 2024-03-23 DIAGNOSIS — E038 Other specified hypothyroidism: Secondary | ICD-10-CM

## 2024-03-23 MED ORDER — LEVOTHYROXINE SODIUM 112 MCG PO TABS
112.0000 ug | ORAL_TABLET | Freq: Every day | ORAL | 3 refills | Status: AC
Start: 2024-03-23 — End: ?

## 2024-03-23 NOTE — Telephone Encounter (Signed)
 Called patient and I was able to reach him today. He wants the referral to lipid clinic. I gave him phone number to call them and schedule an appointment.

## 2024-04-03 ENCOUNTER — Ambulatory Visit: Payer: Self-pay

## 2024-04-03 NOTE — Telephone Encounter (Signed)
 FYI Only or Action Required?: FYI only for provider: appointment scheduled on 04/05/24.  Patient was last seen in primary care on 12/16/2023 by Milon Cleaves, PA.  Called Nurse Triage reporting Abdominal Pain.  Symptoms began today.  Interventions attempted: Nothing.  Symptoms are: unchanged.  Triage Disposition: See PCP Within 2 Weeks  Patient/caregiver understands and will follow disposition?: Yes   Copied from CRM [id number]. Topic: Clinical - Red Word Triage >> Apr 03, 2024  4:36 PM Selinda RAMAN wrote: Red Word that prompted transfer to Nurse Triage: The patient called in complaining of abdominal pain and he said when he went to he bathroom there was blood. I will transfer him to [facility]. Reason for Disposition  Blood on surface of bowel movement with constipation  Answer Assessment - Initial Assessment Questions 1. LOCATION: Where does it hurt?      Abdomen   2. RADIATION: Does the pain shoot anywhere else? (e.g., chest, back)     No  3. ONSET: When did the pain begin? (Minutes, hours or days ago)     Today; pt unsure of specific timing  6. SEVERITY: How bad is the pain?  (e.g., Scale 1-10; mild, moderate, or severe)     3/10  7. RECURRENT SYMPTOM: Have you ever had this type of stomach pain before? If Yes, ask: When was the last time? and What happened that time?      No  8. CAUSE: What do you think is causing the stomach pain? (e.g., gallstones, recent abdominal surgery)     Unknown   9. RELIEVING/AGGRAVATING FACTORS: What makes it better or worse? (e.g., antacids, bending or twisting motion, bowel movement)     Nothing; not taking any medications. Pt reports after having a BM, there was a small amount of bright red blood on the toilet paper   10. OTHER SYMPTOMS: Do you have any other symptoms? (e.g., back pain, diarrhea, fever, urination pain, vomiting)       none  Protocols used: Abdominal Pain - Male-A-AH

## 2024-04-05 ENCOUNTER — Ambulatory Visit: Payer: Self-pay | Admitting: Physician Assistant

## 2024-04-05 ENCOUNTER — Encounter: Payer: Self-pay | Admitting: Physician Assistant

## 2024-04-05 VITALS — BP 108/76 | HR 82 | Temp 98.2°F | Resp 18 | Ht 64.0 in | Wt 175.0 lb

## 2024-04-05 DIAGNOSIS — K625 Hemorrhage of anus and rectum: Secondary | ICD-10-CM

## 2024-04-05 DIAGNOSIS — R351 Nocturia: Secondary | ICD-10-CM

## 2024-04-05 LAB — POCT URINALYSIS DIP (CLINITEK)
Bilirubin, UA: NEGATIVE
Blood, UA: NEGATIVE
Glucose, UA: NEGATIVE mg/dL
Ketones, POC UA: NEGATIVE mg/dL
Leukocytes, UA: NEGATIVE
Nitrite, UA: NEGATIVE
POC PROTEIN,UA: NEGATIVE
Spec Grav, UA: 1.02 (ref 1.010–1.025)
Urobilinogen, UA: 0.2 U/dL
pH, UA: 6 (ref 5.0–8.0)

## 2024-04-05 NOTE — Progress Notes (Signed)
   Acute Office Visit  Subjective:    Patient ID: Garrett Stone, male    DOB: 1971-02-21, 53 y.o.   MRN: [id number]  Chief Complaint  Patient presents with   Abdominal Pain    HPI: Patient is in today for complaints of noting blood in toilet after bowel movement.  He states it only happened once however he states over the past month almost every time with BM he has noted blood on tissue with wiping.  Denies abdominal pain, nausea, vomiting.  Appetite has been normal.  He has not seen any blood in stool and no melena.    Pt also mentions for the past 3 months he has noted increased urine frequency and nocturia.  Denies dysuria or hematuria.  Last PSA was normal but done 9/23.   Current Outpatient Medications:    ezetimibe  (ZETIA ) 10 MG tablet, Take 1 tablet (10 mg total) by mouth daily., Disp: 90 tablet, Rfl: 3   fenofibrate  (TRICOR ) 145 MG tablet, Take 1 tablet (145 mg total) by mouth daily., Disp: 30 tablet, Rfl: 3   levothyroxine  (SYNTHROID ) 112 MCG tablet, Take 1 tablet (112 mcg total) by mouth daily., Disp: 90 tablet, Rfl: 3  Allergies  Allergen Reactions   Crestor  [Rosuvastatin ]    Sulfa Antibiotics Rash    ROS CONSTITUTIONAL: Negative for chills, fatigue, fever, unintentional weight gain and unintentional weight loss.  CARDIOVASCULAR: Negative for chest pain, dizziness, palpitations and pedal edema.  RESPIRATORY: Negative for recent cough and dyspnea.  GASTROINTESTINAL: see HPI      Objective:    PHYSICAL EXAM:   BP 108/76   Pulse 82   Temp 98.2 F (36.8 C) (Temporal)   Resp 18   Ht 5' 4 (1.626 m)   Wt 175 lb (79.4 kg)   SpO2 99%   BMI 30.04 kg/m   Marla Leals CMA - chaperone GEN: Well nourished, well developed, in no acute distress  Cardiac: RRR; no murmurs,  Respiratory:  normal respiratory rate and pattern with no distress - normal breath sounds with no rales, rhonchi, wheezes or rubs GI: normal bowel sounds, no masses or tenderness Rectal  area normal - no fissures - no hemorrhoids noted Skin: warm and dry, no rash  Neuro:  Alert and Oriented x 3,- CN II-Xii grossly intact Psych: euthymic mood, appropriate affect and demeanor     Assessment & Plan:    Nocturia -     POCT URINALYSIS DIP (CLINITEK) -     PSA  Rectal bleeding -     Ambulatory referral to Gastroenterology     Follow-up: Return for needs POC lipid in next few weeks.  An After Visit Summary was printed and given to the patient.  CAMIE JONELLE NICHOLAUS DEVONNA [facility] [phone]

## 2024-04-06 ENCOUNTER — Ambulatory Visit: Payer: Self-pay | Admitting: Physician Assistant

## 2024-04-06 LAB — PSA: Prostate Specific Ag, Serum: 0.8 ng/mL (ref 0.0–4.0)

## 2024-04-20 ENCOUNTER — Ambulatory Visit: Payer: Self-pay

## 2024-06-15 ENCOUNTER — Ambulatory Visit: Payer: Self-pay | Admitting: Physician Assistant
